# Patient Record
Sex: Female | Born: 1970 | Race: White | Hispanic: No | Smoking: Former smoker
Health system: Southern US, Community
[De-identification: ages and names within clinical notes are randomized; demographics above are authoritative.]

## PROBLEM LIST (undated history)

## (undated) DIAGNOSIS — K5792 Diverticulitis of intestine, part unspecified, without perforation or abscess without bleeding: Secondary | ICD-10-CM

## (undated) DIAGNOSIS — K5790 Diverticulosis of intestine, part unspecified, without perforation or abscess without bleeding: Secondary | ICD-10-CM

## (undated) DIAGNOSIS — Z6720 Type B blood, Rh positive: Secondary | ICD-10-CM

## (undated) DIAGNOSIS — I10 Essential (primary) hypertension: Secondary | ICD-10-CM

## (undated) DIAGNOSIS — D1803 Hemangioma of intra-abdominal structures: Secondary | ICD-10-CM

## (undated) DIAGNOSIS — S62101A Fracture of unspecified carpal bone, right wrist, initial encounter for closed fracture: Secondary | ICD-10-CM

## (undated) DIAGNOSIS — O039 Complete or unspecified spontaneous abortion without complication: Secondary | ICD-10-CM

## (undated) HISTORY — DX: Diverticulosis of intestine, part unspecified, without perforation or abscess without bleeding: K57.90

## (undated) HISTORY — PX: COLONOSCOPY: SHX174

## (undated) HISTORY — PX: TRANSTHORACIC ECHOCARDIOGRAM: SHX275

## (undated) HISTORY — DX: Fracture of unspecified carpal bone, right wrist, initial encounter for closed fracture: S62.101A

## (undated) HISTORY — DX: Complete or unspecified spontaneous abortion without complication: O03.9

## (undated) HISTORY — DX: Essential (primary) hypertension: I10

## (undated) HISTORY — DX: Type B blood, Rh positive: Z67.20

---

## 1999-09-28 ENCOUNTER — Other Ambulatory Visit: Admission: RE | Admit: 1999-09-28 | Discharge: 1999-09-28 | Payer: Self-pay | Admitting: Gynecology

## 2001-01-18 ENCOUNTER — Other Ambulatory Visit: Admission: RE | Admit: 2001-01-18 | Discharge: 2001-01-18 | Payer: Self-pay | Admitting: Gynecology

## 2002-01-11 ENCOUNTER — Emergency Department (HOSPITAL_COMMUNITY): Admission: EM | Admit: 2002-01-11 | Discharge: 2002-01-11 | Payer: Self-pay | Admitting: Emergency Medicine

## 2002-02-05 ENCOUNTER — Other Ambulatory Visit: Admission: RE | Admit: 2002-02-05 | Discharge: 2002-02-05 | Payer: Self-pay | Admitting: Gynecology

## 2003-02-07 ENCOUNTER — Other Ambulatory Visit: Admission: RE | Admit: 2003-02-07 | Discharge: 2003-02-07 | Payer: Self-pay | Admitting: Gynecology

## 2003-02-23 HISTORY — PX: LASER ABLATION OF THE CERVIX: SHX1949

## 2003-03-13 ENCOUNTER — Ambulatory Visit (HOSPITAL_COMMUNITY): Admission: RE | Admit: 2003-03-13 | Discharge: 2003-03-13 | Payer: Self-pay | Admitting: Gynecology

## 2003-03-28 ENCOUNTER — Ambulatory Visit (HOSPITAL_BASED_OUTPATIENT_CLINIC_OR_DEPARTMENT_OTHER): Admission: RE | Admit: 2003-03-28 | Discharge: 2003-03-28 | Payer: Self-pay | Admitting: Gynecology

## 2004-02-10 ENCOUNTER — Other Ambulatory Visit: Admission: RE | Admit: 2004-02-10 | Discharge: 2004-02-10 | Payer: Self-pay | Admitting: Gynecology

## 2004-02-23 HISTORY — PX: HERNIA REPAIR: SHX51

## 2004-11-04 ENCOUNTER — Other Ambulatory Visit: Admission: RE | Admit: 2004-11-04 | Discharge: 2004-11-04 | Payer: Self-pay | Admitting: Gynecology

## 2005-02-22 DIAGNOSIS — O039 Complete or unspecified spontaneous abortion without complication: Secondary | ICD-10-CM

## 2005-02-22 HISTORY — DX: Complete or unspecified spontaneous abortion without complication: O03.9

## 2005-03-23 ENCOUNTER — Other Ambulatory Visit: Admission: RE | Admit: 2005-03-23 | Discharge: 2005-03-23 | Payer: Self-pay | Admitting: Gynecology

## 2007-05-12 ENCOUNTER — Inpatient Hospital Stay (HOSPITAL_COMMUNITY): Admission: AD | Admit: 2007-05-12 | Discharge: 2007-05-15 | Payer: Self-pay | Admitting: Obstetrics & Gynecology

## 2008-06-28 ENCOUNTER — Other Ambulatory Visit: Admission: RE | Admit: 2008-06-28 | Discharge: 2008-06-28 | Payer: Self-pay | Admitting: Gynecology

## 2008-06-28 ENCOUNTER — Ambulatory Visit: Payer: Self-pay | Admitting: Women's Health

## 2008-06-28 ENCOUNTER — Encounter: Payer: Self-pay | Admitting: Women's Health

## 2008-07-26 ENCOUNTER — Ambulatory Visit: Payer: Self-pay | Admitting: Women's Health

## 2009-09-01 ENCOUNTER — Ambulatory Visit: Payer: Self-pay | Admitting: Women's Health

## 2009-09-01 ENCOUNTER — Other Ambulatory Visit: Admission: RE | Admit: 2009-09-01 | Discharge: 2009-09-01 | Payer: Self-pay | Admitting: Gynecology

## 2009-09-17 ENCOUNTER — Ambulatory Visit: Payer: Self-pay | Admitting: Women's Health

## 2010-07-07 NOTE — Discharge Summary (Signed)
NAMEAUDINE, [name]           ACCOUNT NO.:  [id number]   MEDICAL RECORD NO.:  [id number]          PATIENT TYPE:  INP   LOCATION:  9136                          FACILITY:  [facility]   PHYSICIAN:  Gerrit Friends. Aldona Bar, M.D.   DATE OF BIRTH:  05-Apr-1970   DATE OF ADMISSION:  05/12/2007  DATE OF DISCHARGE:  05/15/2007                               DISCHARGE SUMMARY   DISCHARGE DIAGNOSES:  1. Term pregnancy, delivered 8-pound 11-ounce female infant.  Apgars 8      and 9.  2. Blood type B+.  3. Known placenta previa.  4. Labor and bleeding.   PROCEDURE:  Primary low-transverse cesarean section.   SUMMARY:  This 40 year old gravida 3, para 0 with a due date of May 22, 2007, presented at slightly less than 39 weeks with vaginal bleeding  and mild contractions with known placenta previa.  She was diagnosed on  ultrasound antenatally and was scheduled for a primary low-transverse  cesarean section on May 15, 2007, but presented on May 12, 2007,  having mild contractions and vaginal bleeding.  She was group B strep  positive and did receive ampicillin prior to go into the operating room.  She was taken to the operating room on May 12, 2007, by Dr. Dareen Piano,  at which time she was delivered by primary low-transverse cesarean  section of an 8-pound 11-ounce female infant with Apgars of 8 and 9.  Postpartum course was benign.  Discharge hemoglobin 9.7 with a white  count of 10,000, and platelet count of 159,000.  On the morning of May 15, 2007, she was ambulating well, tolerating a regular diet well,  having normal bowel and bladder function.  She was afebrile.  The wound  was clean and dry, and prior to discharge, her staples were removed, and  wound was Steri-Striped with benzoin.   She was given all appropriate instructions at the time of discharge and  understood all instructions well.   DISCHARGE MEDICATIONS:  1. Feosol capsules - 1 daily.  2. Vitamins - 1 a day.  3. Motrin 600  mg every 6 hours as needed for cramping or mild pain.  4. Tylox 1-2 every 4-6 hours as needed for more severe pain.   She will return to the office for followup in approximately 4 weeks'  time.   CONDITION ON DISCHARGE:  Improved.      Gerrit Friends. Aldona Bar, M.D.  Electronically Signed     RMW/MEDQ  D:  05/15/2007  T:  05/16/2007  Job:  [id number]

## 2010-07-07 NOTE — Op Note (Signed)
[name], [name]           ACCOUNT NO.:  [id number]   MEDICAL RECORD NO.:  [id number]          PATIENT TYPE:  INP   LOCATION:  9199                          [facility]ACILITY:  [facility]   PHYSICIAN:  Malva Limes, M.D.    DATE OF BIRTH:  [birth year]   DATE OF PROCEDURE:  05/12/2007  DATE OF DISCHARGE:                               OPERATIVE REPORT   PREOPERATIVE DIAGNOSIS:  1. Intrauterine pregnancy at term.  2. Partial placenta previa, currently bleeding.   POSTOPERATIVE DIAGNOSES:  1. Intrauterine pregnancy at term.  2. Partial placenta previa, currently bleeding.   PROCEDURE:  Primary low transverse cesarean section.   SURGEON:  Malva Limes, M.D.   ANESTHESIA:  Spinal.   ANTIBIOTICS:  Ancef 1 g.   DRAINS:  Foley to bedside drainage.   ESTIMATED BLOOD LOSS:  900 mL.   SPECIMENS:  None.   COMPLICATIONS:  None   FINDINGS:  The patient had normal fallopian tubes bilaterally.  She did  have a 2 cm subserosal fibroid on the anterior fundus.  Uterine cavity  appeared to be normal.   PROCEDURE:  The patient was taken to the operating room, where a spinal  anesthetic was administered without complications.  She was then placed  in the dorsal supine position, with a left lateral tilt.  The patient  was prepped with Betadine, and a Foley catheter was placed.  She was  then draped in the usual fashion for this procedure.  A Pfannenstiel  incision was made.  This was carried down to the fascia.  The fascia was  entered in the midline and extended laterally with the Mayo scissors.  Rectus muscles were then separated from the fascia with the Bovie.  Rectus muscles were then divided in the midline and taken superiorly and  inferiorly.  The parietal peritoneum was entered sharply, taken  superiorly and inferiorly.  Bladder flap was taken down sharply.  A low  transverse uterine incision was made in the midline and extended  laterally with blunt dissection.  Amniotic fluid was noted to  be clear.  The infant was delivered in a vertex presentation.  On delivery of the  head, the oropharynx and nostrils were bulb suctioned.  The remaining  infant was then delivered.  The cord was doubly clamped and cut and the  infant handed to the awaiting NICU team.  The placenta was then manually  removed.  The uterus was exteriorized and examined.  The uterine cavity  was cleaned with a wet lap.  The uterine incision was closed in a single  layer of 0 Monocryl in a running-locking fashion.  The bladder flap was  closed using 2-0 Monocryl suture in a running fashion.  The uterus was  placed back into the abdominal cavity.  Hemostasis was checked and felt  to be adequate.  The parietal peritoneum and rectus muscles were  approximated in the midline using 2-0 Monocryl in a running fashion.  The fascia was closed using 0 Monocryl suture in a running fashion.  Subcuticular tissue was made hemostatic with the Bovie.  Stainless steel  clips were used to close the  skin.  The patient tolerated the procedure  well.  She was taken to the recovery room in stable condition.  Instrument and lap counts were correct x2.           ______________________________  Malva Limes, M.D.     MA/MEDQ  D:  05/12/2007  T:  05/12/2007  Job:  [id number]

## 2010-07-10 NOTE — Op Note (Signed)
[name], [name]                     ACCOUNT NO.:  [id number]   MEDICAL RECORD NO.:  [id number]                   PATIENT TYPE:  AMB   LOCATION:  NESC                                 FACILITY:  [facility]   PHYSICIAN:  Juan H. Lily Peer, M.D.             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   PREOPERATIVE DIAGNOSES:  Cervical intraepithelial neoplasia I.   POSTOPERATIVE DIAGNOSIS:  Cervical intraepithelial neoplasia I.   ANESTHESIA:  General endotracheal anesthesia.   SURGEON:  Juan H. Lily Peer, M.D.   PROCEDURE PERFORMED:  CO2 laser ablation of cervical dysplasia.   DESCRIPTION OF OPERATION:  After the patient was adequately counseled, she  received intravenous sedation, was placed in the high lithotomy position.  The vagina was prepped and draped in the usual sterile fashion.  A titanium-  coated speculum was inserted into the vaginal vault with the vacuum  extractor attached to it.  The cervix was cleansed with acetic acid.  The  colposcope was brought into view.  The previously marked area at 4 o'clock  and 8 o'clock which had demonstrated the cervical dysplasia was once again  identified with the CO2 laser beam with a 1.5 mm spot size and the CO2 laser  wattage set at 10 watts.  First at 8 watts, a circumferential demarcation of  the ectocervix was made outlining the course to be ablated, taking into  account the previously biopsied site at the 4 o'clock and 8 o'clock position  that had the dysplasia.  We kept at least 3-4 mm away from the lesion site,  and then the CO2 laser was increased to 10 watts continuous  mode, and the  area was completely ablated to a depth of approximately 6 mm, to include the  transformation zone.  The patient tolerated the procedure well, and Monsel  solution was used for hemostasis.  No other lesions were noted, and the  patient was transferred to recovery room with stable vital signs.  Blood  loss was minimal, and fluid resuscitation consisted of 1000 mL of lactated  Ringer's.                                               Juan H. Lily Peer, M.D.    JHF/MEDQ  D:  03/28/2003  T:  03/28/2003  Job:  [id number]

## 2010-07-10 NOTE — H&P (Signed)
[name], [name]                     ACCOUNT NO.:  [id number]   MEDICAL RECORD NO.:  [id number]                   PATIENT TYPE:   LOCATION:                                       FACILITY:  [facility]   PHYSICIAN:  Juan H. Lily Peer, M.D.             DATE OF BIRTH:  19-Jan-1971   DATE OF ADMISSION:  03/28/2003  DATE OF DISCHARGE:                                HISTORY & PHYSICAL   CHIEF COMPLAINT:  Cervical dysplasia.   HISTORY:  The patient is a 40 year old gravida 1, para 0, AB  1, who was  seen in the office for her annual gynecological examination on February 07, 2003.  Her Pap smear at that time had demonstrated CIN-1/Vain 1 and HPV in a  few cells.  She underwent a detailed colposcopic evaluation which  demonstrated that the ectocervix at the 4 o'clock position CIN-I, and as  well in the 9 o'clock position.  She had a benign ECC.  She is scheduled to  undergo CO2 laser ablation of her cervix for dysplasia.   PAST MEDICAL HISTORY:  1. The patient is on Ortho-Tri-Cyclen-Lo for contraception.  2. She suffers from anxiety and depression, but is currently on no     medication.  3. She recently had a CT of the abdomen for this lower abdominal wall mass     which corresponded to fat tissue, and she was referred to the general     surgeon, in the event that she would want to have that removed at a later     date.  She denies any prior history of any abnormal Pap smears in the past, and no  other medical problems reported.   ALLERGIES:  ASPIRIN.   FAMILY HISTORY:  Diabetes in the mother and grandfather.  Hypertension in  her maternal grandfather, as well as cardiovascular disease.   PHYSICAL EXAMINATION:  VITAL SIGNS:  Weight 150 pounds, height 5 feet 5  inches tall, blood pressure 100/60.  HEENT:  Unremarkable.  NECK:  Supple.  Trachea midline.  No carotid bruits.  No thyromegaly.  LUNGS:  Clear to auscultation without rhonchi or wheezes.  HEART:  A regular rate and  rhythm.  No murmurs or gallops.  BREASTS:      Examination was not done.  ABDOMEN:  Soft, nontender, without rebound or guarding.  PELVIC:  Bartholin, urethral and Skene's glands were within normal limits.  Vagina and cervix with the lesion described above colposcopically.  RECTAL:  Examination not done.   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   PLAN:  She will undergo a CO2 laser ablation of her cervical dysplasia.  The  risks, benefits and the pros and cons of the procedure were discussed with  the patient, to include infection, bleeding, trauma, or any advertent injury  from the laser beam.  All her  questions were answered.  The patient is  scheduled to undergo a CO2 laser ablation for cervical dysplasia on  Thursday, March 28, 2003, at the [facility].                                               Juan H. Lily Peer, M.D.    JHF/MEDQ  D:  03/26/2003  T:  03/26/2003  Job:  [id number]

## 2010-11-16 LAB — CBC
HCT: 27.1 — ABNORMAL LOW
HCT: 37.3
Hemoglobin: 13
Hemoglobin: 9.7 — ABNORMAL LOW
MCHC: 34.9
MCHC: 36
MCV: 90.7
MCV: 90.9
Platelets: 159
Platelets: 177
RBC: 2.98 — ABNORMAL LOW
RBC: 4.1
RDW: 13.3
RDW: 13.4
WBC: 10
WBC: 6.6

## 2010-11-16 LAB — RPR: RPR Ser Ql: NONREACTIVE

## 2011-06-18 ENCOUNTER — Ambulatory Visit (INDEPENDENT_AMBULATORY_CARE_PROVIDER_SITE_OTHER): Payer: Managed Care, Other (non HMO) | Admitting: Women's Health

## 2011-06-18 ENCOUNTER — Encounter: Payer: Self-pay | Admitting: Women's Health

## 2011-06-18 ENCOUNTER — Other Ambulatory Visit (HOSPITAL_COMMUNITY)
Admission: RE | Admit: 2011-06-18 | Discharge: 2011-06-18 | Disposition: A | Discharge status: Home / Self Care | Payer: Managed Care, Other (non HMO) | Source: Ambulatory Visit | Attending: Obstetrics and Gynecology | Admitting: Obstetrics and Gynecology

## 2011-06-18 VITALS — BP 120/90 | Ht 65.5 in | Wt 161.0 lb

## 2011-06-18 DIAGNOSIS — Z01419 Encounter for gynecological examination (general) (routine) without abnormal findings: Secondary | ICD-10-CM

## 2011-06-18 DIAGNOSIS — Z833 Family history of diabetes mellitus: Secondary | ICD-10-CM

## 2011-06-18 DIAGNOSIS — F419 Anxiety disorder, unspecified: Secondary | ICD-10-CM

## 2011-06-18 DIAGNOSIS — N870 Mild cervical dysplasia: Secondary | ICD-10-CM

## 2011-06-18 DIAGNOSIS — F32A Depression, unspecified: Secondary | ICD-10-CM | POA: Insufficient documentation

## 2011-06-18 DIAGNOSIS — F411 Generalized anxiety disorder: Secondary | ICD-10-CM

## 2011-06-18 DIAGNOSIS — E079 Disorder of thyroid, unspecified: Secondary | ICD-10-CM

## 2011-06-18 DIAGNOSIS — F329 Major depressive disorder, single episode, unspecified: Secondary | ICD-10-CM | POA: Insufficient documentation

## 2011-06-18 DIAGNOSIS — Z13220 Encounter for screening for lipoid disorders: Secondary | ICD-10-CM

## 2011-06-18 LAB — CBC WITH DIFFERENTIAL/PLATELET
Basophils Absolute: 0 10*3/uL (ref 0.0–0.1)
Basophils Relative: 0 % (ref 0–1)
Eosinophils Absolute: 0.1 10*3/uL (ref 0.0–0.7)
Eosinophils Relative: 3 % (ref 0–5)
HCT: 41.4 % (ref 36.0–46.0)
Hemoglobin: 14.1 g/dL (ref 12.0–15.0)
Lymphocytes Relative: 35 % (ref 12–46)
Lymphs Abs: 1.4 10*3/uL (ref 0.7–4.0)
MCH: 30.7 pg (ref 26.0–34.0)
MCHC: 34.1 g/dL (ref 30.0–36.0)
MCV: 90 fL (ref 78.0–100.0)
Monocytes Absolute: 0.2 10*3/uL (ref 0.1–1.0)
Monocytes Relative: 6 % (ref 3–12)
Neutro Abs: 2.2 10*3/uL (ref 1.7–7.7)
Neutrophils Relative %: 55 % (ref 43–77)
Platelets: 268 10*3/uL (ref 150–400)
RBC: 4.6 MIL/uL (ref 3.87–5.11)
RDW: 12.5 % (ref 11.5–15.5)
WBC: 4 10*3/uL (ref 4.0–10.5)

## 2011-06-18 LAB — LIPID PANEL
Cholesterol: 256 mg/dL — ABNORMAL HIGH (ref 0–200)
HDL: 64 mg/dL (ref >39)
LDL Cholesterol: 167 mg/dL — ABNORMAL HIGH (ref 0–99)
Total CHOL/HDL Ratio: 4 Ratio
Triglycerides: 127 mg/dL (ref <150)
VLDL: 25 mg/dL (ref 0–40)

## 2011-06-18 LAB — GLUCOSE, RANDOM: Glucose, Bld: 92 mg/dL (ref 70–99)

## 2011-06-18 MED ORDER — CITALOPRAM HYDROBROMIDE 10 MG PO TABS: 10.0000 mg | ORAL_TABLET | Freq: Every day | ORAL | Status: DC

## 2011-06-18 NOTE — Progress Notes (Signed)
[name] September 18, 1970 [id number]    History:    The patient presents for annual exam.  Monthly 4-5 day cycles/condoms. 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 Normal Paps since 2007. Has not had a mammogram. Has struggled with anxiety and depression, history of bulimia in the past. Currently on no medication.   Past medical history, past surgical history, family history and social history were all reviewed and documented in the EPIC chart. Daughter Penni Bombard 41 years old, doing well.   ROS:  A  ROS was performed and pertinent positives and negatives are included in the history.  Exam:  Filed Vitals:   06/18/11 1216  BP: 120/90    General appearance:  Normal Head/Neck:  Normal, without cervical or supraclavicular adenopathy. Thyroid:  Symmetrical, normal in size, without palpable masses or nodularity. Respiratory  Effort:  Normal  Auscultation:  Clear without wheezing or rhonchi Cardiovascular  Auscultation:  Regular rate, without rubs, murmurs or gallops  Edema/varicosities:  Not grossly evident Abdominal  Soft,nontender, without masses, guarding or rebound.  Liver/spleen:  No organomegaly noted  Hernia:  None appreciated  Skin  Inspection:  Grossly normal  Palpation:  Grossly normal Neurologic/psychiatric  Orientation:  Normal with appropriate conversation.  Mood/affect:  Normal  Genitourinary    Breasts: Examined lying and sitting.     Right: Without masses, retractions, discharge or axillary adenopathy.     Left: Without masses, retractions, discharge or axillary adenopathy.   Inguinal/mons:  Normal without inguinal adenopathy  External genitalia:  Normal  BUS/Urethra/Skene's glands:  Normal  Bladder:  Normal  Vagina:  Normal  Cervix:  Normal  Uterus:   normal in size, shape and contour.  Midline and mobile  Adnexa/parametria:     Rt: Without masses or tenderness.   Lt: Without masses or tenderness.  Anus and perineum: Normal  Digital rectal  exam: Normal sphincter tone without palpated masses or tenderness  Assessment/Plan:  41 y.o. M. WF G3 P1 for annual exam with complaint of headaches prior to menstrual cycle.Marland Kitchen   History of CIN-1 in 05/laser treatment Anxiety/depression  Plan: Has seen Berniece Andreas in the past, instructed to schedule counseling. Celexa 10 mg, will start daily, reviewed importance of exercise, increasingly leisure activities, and counseling. Denies any feelings of harming self. SBE's, schedule screening mammogram, increased regular daily exercise, decrease calories for weight loss, calcium rich diet, MVI daily encouraged. Will try Motrin day prior and first day of cycle for headaches, instructed to call if headaches continue. CBC, TSH, glucose, lipid profile, UA and Pap.   Harrington Challenger WHNP, 1:16 PM 06/18/2011

## 2011-06-18 NOTE — Patient Instructions (Signed)

## 2011-06-18 NOTE — Progress Notes (Signed)
Addended by: Venora Maples on: 06/18/2011 02:49 PM   Modules accepted: Orders

## 2011-06-19 LAB — URINALYSIS W MICROSCOPIC + REFLEX CULTURE
Bacteria, UA: NONE SEEN
Bilirubin Urine: NEGATIVE
Casts: NONE SEEN
Crystals: NONE SEEN
Glucose, UA: NEGATIVE mg/dL
Hgb urine dipstick: NEGATIVE
Ketones, ur: NEGATIVE mg/dL
Nitrite: NEGATIVE
Protein, ur: NEGATIVE mg/dL
Specific Gravity, Urine: 1.016 (ref 1.005–1.030)
Squamous Epithelial / HPF: NONE SEEN
Urobilinogen, UA: 0.2 mg/dL (ref 0.0–1.0)
pH: 5.5 (ref 5.0–8.0)

## 2011-06-19 LAB — TSH: TSH: 1.358 u[IU]/mL (ref 0.350–4.500)

## 2011-06-20 LAB — URINE CULTURE
Colony Count: NO GROWTH
Organism ID, Bacteria: NO GROWTH

## 2011-06-21 ENCOUNTER — Other Ambulatory Visit: Payer: Self-pay | Admitting: *Deleted

## 2011-06-21 ENCOUNTER — Encounter: Payer: Self-pay | Admitting: Women's Health

## 2011-06-21 DIAGNOSIS — E7800 Pure hypercholesterolemia, unspecified: Secondary | ICD-10-CM

## 2011-07-09 ENCOUNTER — Other Ambulatory Visit: Payer: Self-pay | Admitting: Women's Health

## 2011-07-09 ENCOUNTER — Telehealth: Payer: Self-pay | Admitting: *Deleted

## 2011-07-09 DIAGNOSIS — F329 Major depressive disorder, single episode, unspecified: Secondary | ICD-10-CM

## 2011-07-09 MED ORDER — ESCITALOPRAM OXALATE 10 MG PO TABS: 10.0000 mg | ORAL_TABLET | Freq: Every day | ORAL | Status: DC

## 2011-07-09 NOTE — Telephone Encounter (Signed)
Pt was given celexa 10 mg on 06/18/11 pt took her first dose on Saturday 07/03/11 and yesterday experienced tingling inside her mouth and lip felt numb on right side with slight swelling. She was out of town recently an got bit by some type of bug on her legs. Pt would like you advice, call back number [phone]

## 2011-07-09 NOTE — Telephone Encounter (Signed)
Telephone call, instructed to stop the Celexa, could be a drug allergy. Instructed to call if tingling and mouth continues. Patient states she has felt better, less down. Will wait about a week and then try Lexapro 10 mg by mouth daily. Instructed to call if symptoms continue and continue counseling.

## 2012-02-03 ENCOUNTER — Telehealth: Payer: Self-pay | Admitting: *Deleted

## 2012-02-03 DIAGNOSIS — F329 Major depressive disorder, single episode, unspecified: Secondary | ICD-10-CM

## 2012-02-03 MED ORDER — ESCITALOPRAM OXALATE 10 MG PO TABS: 10.0000 mg | ORAL_TABLET | Freq: Every day | ORAL | Status: DC

## 2012-02-03 NOTE — Telephone Encounter (Signed)
Telephone call to review Lexapro request, states is doing well on Lexapro 10 would like to continue. States does not feel she needs counseling at this point has had in the past. States life is better, changing jobs to a local position to avoid frequent travel.  Jen  -  Please call in Lexapro 10 mg daily # 30 with #6 refills.

## 2012-02-03 NOTE — Telephone Encounter (Signed)
Pt called requesting refill on Lexapro 10 mg. Okay to fill?

## 2012-02-03 NOTE — Telephone Encounter (Signed)
rx sent

## 2012-02-24 ENCOUNTER — Telehealth: Payer: Self-pay | Admitting: *Deleted

## 2012-02-24 MED ORDER — ESCITALOPRAM OXALATE 10 MG PO TABS: 10.0000 mg | ORAL_TABLET | Freq: Every day | ORAL | Status: DC

## 2012-02-24 NOTE — Telephone Encounter (Signed)
Pt called requesting 90 day supply of lexapro 10 mg. Rx sent.

## 2012-02-25 ENCOUNTER — Encounter: Payer: Self-pay | Admitting: Gynecology

## 2014-02-17 ENCOUNTER — Emergency Department (HOSPITAL_BASED_OUTPATIENT_CLINIC_OR_DEPARTMENT_OTHER): Payer: BC Managed Care – PPO

## 2014-02-17 ENCOUNTER — Encounter (HOSPITAL_BASED_OUTPATIENT_CLINIC_OR_DEPARTMENT_OTHER): Payer: Self-pay | Admitting: *Deleted

## 2014-02-17 ENCOUNTER — Emergency Department (HOSPITAL_BASED_OUTPATIENT_CLINIC_OR_DEPARTMENT_OTHER)
Admission: EM | Admit: 2014-02-17 | Discharge: 2014-02-17 | Disposition: A | Discharge status: Home / Self Care | Payer: BC Managed Care – PPO | Attending: Emergency Medicine | Admitting: Emergency Medicine

## 2014-02-17 DIAGNOSIS — R05 Cough: Secondary | ICD-10-CM

## 2014-02-17 DIAGNOSIS — Z7952 Long term (current) use of systemic steroids: Secondary | ICD-10-CM | POA: Insufficient documentation

## 2014-02-17 DIAGNOSIS — R059 Cough, unspecified: Secondary | ICD-10-CM

## 2014-02-17 DIAGNOSIS — Z792 Long term (current) use of antibiotics: Secondary | ICD-10-CM | POA: Insufficient documentation

## 2014-02-17 DIAGNOSIS — F419 Anxiety disorder, unspecified: Secondary | ICD-10-CM | POA: Diagnosis not present

## 2014-02-17 DIAGNOSIS — J159 Unspecified bacterial pneumonia: Secondary | ICD-10-CM | POA: Insufficient documentation

## 2014-02-17 DIAGNOSIS — Z87448 Personal history of other diseases of urinary system: Secondary | ICD-10-CM | POA: Diagnosis not present

## 2014-02-17 DIAGNOSIS — R079 Chest pain, unspecified: Secondary | ICD-10-CM | POA: Diagnosis present

## 2014-02-17 DIAGNOSIS — Z79899 Other long term (current) drug therapy: Secondary | ICD-10-CM | POA: Insufficient documentation

## 2014-02-17 DIAGNOSIS — J189 Pneumonia, unspecified organism: Secondary | ICD-10-CM

## 2014-02-17 DIAGNOSIS — F329 Major depressive disorder, single episode, unspecified: Secondary | ICD-10-CM | POA: Diagnosis not present

## 2014-02-17 LAB — CBC WITH DIFFERENTIAL/PLATELET
Basophils Absolute: 0 10*3/uL (ref 0.0–0.1)
Basophils Relative: 0 % (ref 0–1)
Eosinophils Absolute: 0.1 10*3/uL (ref 0.0–0.7)
Eosinophils Relative: 1 % (ref 0–5)
HCT: 39.9 % (ref 36.0–46.0)
Hemoglobin: 13.7 g/dL (ref 12.0–15.0)
Lymphocytes Relative: 11 % — ABNORMAL LOW (ref 12–46)
Lymphs Abs: 1 10*3/uL (ref 0.7–4.0)
MCH: 31.1 pg (ref 26.0–34.0)
MCHC: 34.3 g/dL (ref 30.0–36.0)
MCV: 90.5 fL (ref 78.0–100.0)
Monocytes Absolute: 0.5 10*3/uL (ref 0.1–1.0)
Monocytes Relative: 6 % (ref 3–12)
Neutro Abs: 7.4 10*3/uL (ref 1.7–7.7)
Neutrophils Relative %: 82 % — ABNORMAL HIGH (ref 43–77)
Platelets: 263 10*3/uL (ref 150–400)
RBC: 4.41 MIL/uL (ref 3.87–5.11)
RDW: 11.9 % (ref 11.5–15.5)
WBC: 9 10*3/uL (ref 4.0–10.5)

## 2014-02-17 LAB — COMPREHENSIVE METABOLIC PANEL
ALT: 16 U/L (ref 0–35)
AST: 19 U/L (ref 0–37)
Albumin: 4.1 g/dL (ref 3.5–5.2)
Alkaline Phosphatase: 75 U/L (ref 39–117)
Anion gap: 9 (ref 5–15)
BUN: 10 mg/dL (ref 6–23)
CO2: 26 mmol/L (ref 19–32)
Calcium: 8.7 mg/dL (ref 8.4–10.5)
Chloride: 104 mEq/L (ref 96–112)
Creatinine, Ser: 0.55 mg/dL (ref 0.50–1.10)
GFR calc Af Amer: >90 mL/min (ref >90)
GFR calc non Af Amer: >90 mL/min (ref >90)
Glucose, Bld: 115 mg/dL — ABNORMAL HIGH (ref 70–99)
Potassium: 3.8 mmol/L (ref 3.5–5.1)
Sodium: 139 mmol/L (ref 135–145)
Total Bilirubin: 0.6 mg/dL (ref 0.3–1.2)
Total Protein: 6.9 g/dL (ref 6.0–8.3)

## 2014-02-17 LAB — TROPONIN I: Troponin I: <0.03 ng/mL (ref <0.031)

## 2014-02-17 LAB — LIPASE, BLOOD: Lipase: 29 U/L (ref 11–59)

## 2014-02-17 MED ORDER — IOHEXOL 350 MG/ML SOLN
100.0000 mL | Freq: Once | INTRAVENOUS | Status: AC | PRN
  Administered 2014-02-17: 100 mL via INTRAVENOUS

## 2014-02-17 MED ORDER — PREDNISONE 20 MG PO TABS: 40.0000 mg | ORAL_TABLET | Freq: Every day | ORAL | Status: DC

## 2014-02-17 MED ORDER — GI COCKTAIL ~~LOC~~
30.0000 mL | Freq: Once | ORAL | Status: AC
  Administered 2014-02-17: 30 mL via ORAL
  Filled 2014-02-17: qty 30

## 2014-02-17 MED ORDER — HYDROCODONE-HOMATROPINE 5-1.5 MG/5ML PO SYRP: 5.0000 mL | ORAL_SOLUTION | Freq: Four times a day (QID) | ORAL | Status: DC | PRN

## 2014-02-17 MED ORDER — MORPHINE SULFATE 4 MG/ML IJ SOLN: 4.0000 mg | Freq: Once | INTRAMUSCULAR | Status: DC

## 2014-02-17 MED ORDER — ONDANSETRON HCL 4 MG/2ML IJ SOLN: 4.0000 mg | Freq: Once | INTRAMUSCULAR | Status: DC

## 2014-02-17 MED ORDER — DILTIAZEM HCL 25 MG/5ML IV SOLN
20.0000 mg | Freq: Once | INTRAVENOUS | Status: DC
  Filled 2014-02-17: qty 5

## 2014-02-17 MED ORDER — LEVOFLOXACIN 750 MG PO TABS: 750.0000 mg | ORAL_TABLET | Freq: Every day | ORAL | Status: DC

## 2014-02-17 NOTE — Discharge Instructions (Signed)
Return to the ER if you have worsening pain or shortness of breath. Follow-up with your doctor next week for a recheck and to consider repeating the CAT scan in 3 months as suggested by radiologist.  Pneumonia Pneumonia is an infection of the lungs.  CAUSES Pneumonia may be caused by bacteria or a virus. Usually, these infections are caused by breathing infectious particles into the lungs (respiratory tract). SIGNS AND SYMPTOMS   Cough.  Fever.  Chest pain.  Increased rate of breathing.  Wheezing.  Mucus production. DIAGNOSIS  If you have the common symptoms of pneumonia, your health care provider will typically confirm the diagnosis with a chest X-ray. The X-ray will show an abnormality in the lung (pulmonary infiltrate) if you have pneumonia. Other tests of your blood, urine, or sputum may be done to find the specific cause of your pneumonia. Your health care provider may also do tests (blood gases or pulse oximetry) to see how well your lungs are working. TREATMENT  Some forms of pneumonia may be spread to other people when you cough or sneeze. You may be asked to wear a mask before and during your exam. Pneumonia that is caused by bacteria is treated with antibiotic medicine. Pneumonia that is caused by the influenza virus may be treated with an antiviral medicine. Most other viral infections must run their course. These infections will not respond to antibiotics.  HOME CARE INSTRUCTIONS   Cough suppressants may be used if you are losing too much rest. However, coughing protects you by clearing your lungs. You should avoid using cough suppressants if you can.  Your health care provider may have prescribed medicine if he or she thinks your pneumonia is caused by bacteria or influenza. Finish your medicine even if you start to feel better.  Your health care provider may also prescribe an expectorant. This loosens the mucus to be coughed up.  Take medicines only as directed by your  health care provider.  Do not smoke. Smoking is a common cause of bronchitis and can contribute to pneumonia. If you are a smoker and continue to smoke, your cough may last several weeks after your pneumonia has cleared.  A cold steam vaporizer or humidifier in your room or home may help loosen mucus.  Coughing is often worse at night. Sleeping in a semi-upright position in a recliner or using a couple pillows under your head will help with this.  Get rest as you feel it is needed. Your body will usually let you know when you need to rest. PREVENTION A pneumococcal shot (vaccine) is available to prevent a common bacterial cause of pneumonia. This is usually suggested for:  People over 58 years old.  Patients on chemotherapy.  People with chronic lung problems, such as bronchitis or emphysema.  People with immune system problems. If you are over 65 or have a high risk condition, you may receive the pneumococcal vaccine if you have not received it before. In some countries, a routine influenza vaccine is also recommended. This vaccine can help prevent some cases of pneumonia.You may be offered the influenza vaccine as part of your care. If you smoke, it is time to quit. You may receive instructions on how to stop smoking. Your health care provider can provide medicines and counseling to help you quit. SEEK MEDICAL CARE IF: You have a fever. SEEK IMMEDIATE MEDICAL CARE IF:   Your illness becomes worse. This is especially true if you are elderly or weakened from any other  disease.  You cannot control your cough with suppressants and are losing sleep.  You begin coughing up blood.  You develop pain which is getting worse or is uncontrolled with medicines.  Any of the symptoms which initially brought you in for treatment are getting worse rather than better.  You develop shortness of breath or chest pain. MAKE SURE YOU:   Understand these instructions.  Will watch your  condition.  Will get help right away if you are not doing well or get worse. Document Released: 02/08/2005 Document Revised: 06/25/2013 Document Reviewed: 04/30/2010 [facility] Patient Information 2015 [place], [place]. This information is not intended to replace advice given to you by your health care provider. Make sure you discuss any questions you have with your health care provider.

## 2014-02-17 NOTE — ED Notes (Signed)
C/o R chest pain, "feels like indigestion", also mentions radiation to jaw and arm. Reports productive cough x1 week (green & thick) and hoarse voice, developed nv onset at 0200, vomited x3. (denies: fever, sob, diarrhea bleeding or dizziness), [facility] and dx'd with sinus and ear infection, started on augmentin, flonase and albuterol. Rates pain 5/10,

## 2014-02-17 NOTE — ED Notes (Addendum)
Returns from xray, no changes, remains alert, NAD, calm, interactive. Husband at [facility]. Attempted IV x1 (unsuccessful). Blood obtained and sent. Pt "wants to wait on IV".

## 2014-02-17 NOTE — ED Notes (Signed)
Patient denies pain and nausea, doesn't want meds at this time

## 2014-02-17 NOTE — ED Provider Notes (Signed)
CSN: [id number]     Arrival date & time 02/17/14  0522 History   First MD Initiated Contact with Patient 02/17/14 [id number]     Chief Complaint  Patient presents with  . Chest Pain     (Consider location/radiation/quality/duration/timing/severity/associated sxs/prior Treatment) HPI Comments: Patient presents to the ER for evaluation of chest pain. Patient reports that she awakened early this morning with right-sided chest pain. She reports that the pain was present for a couple of hours before she decided to come to the ER. She is not short of breath. She does, however, report that she has been sick this week. She has been experiencing increasing cough which is productive and has had a headache and sinus congestion. [facility] and started on Augmentin. She took the first dose last night prior to onset of the symptoms.  Patient is a 43 y.o. female presenting with chest pain.  Chest Pain Associated symptoms: cough     Past Medical History  Diagnosis Date  . SAB (spontaneous abortion) 2007  . Type B blood, Rh positive   . Anxiety   . Depression   . Bulimia     history of  . CIN I (cervical intraepithelial neoplasia I)    Past Surgical History  Procedure Laterality Date  . Laser ablation of the cervix  2005    cin I  . Hernia repair  2006  . Cesarean section  2009   Family History  Problem Relation Age of Onset  . Diabetes Maternal Grandfather   . Hypertension Maternal Grandfather    History  Substance Use Topics  . Smoking status: Never Smoker   . Smokeless tobacco: Not on file  . Alcohol Use: Yes   OB History    No data available     Review of Systems  HENT: Positive for congestion.   Respiratory: Positive for cough.   Cardiovascular: Positive for chest pain.  All other systems reviewed and are negative.     Allergies  Aspirin and Other  Home Medications   Prior to Admission medications   Medication Sig Start Date End Date  Taking? Authorizing Provider  escitalopram (LEXAPRO) 10 MG tablet Take 1 tablet (10 mg total) by mouth daily. 07/09/11 10/07/11  Huel Cote, NP  escitalopram (LEXAPRO) 10 MG tablet Take 1 tablet (10 mg total) by mouth daily. 02/24/12   Huel Cote, NP  HYDROcodone-homatropine [facility]) 5-1.5 MG/5ML syrup Take 5 mLs by mouth every 6 (six) hours as needed for cough. 02/17/14   Orpah Greek, MD  levofloxacin (LEVAQUIN) 750 MG tablet Take 1 tablet (750 mg total) by mouth daily. 02/17/14   Orpah Greek, MD  predniSONE (DELTASONE) 20 MG tablet Take 2 tablets (40 mg total) by mouth daily with breakfast. 02/17/14   Orpah Greek, MD   BP 131/84 mmHg  Pulse 91  Temp(Src) 98.6 F (37 C) (Oral)  Resp 16  Ht 5\' 5"  (1.651 m)  Wt 160 lb (72.576 kg)  BMI 26.63 kg/m2  SpO2 97%  LMP 02/08/2014 Physical Exam  Constitutional: She is oriented to person, place, and time. She appears well-developed and well-nourished. No distress.  HENT:  Head: Normocephalic and atraumatic.  Right Ear: Hearing normal.  Left Ear: Hearing normal.  Nose: Nose normal.  Mouth/Throat: Oropharynx is clear and moist and mucous membranes are normal.  Eyes: Conjunctivae and EOM are normal. Pupils are equal, round, and reactive to light.  Neck: Normal range of  motion. Neck supple.  Cardiovascular: Regular rhythm, S1 normal and S2 normal.  Exam reveals no gallop and no friction rub.   No murmur heard. Pulmonary/Chest: Effort normal and breath sounds normal. No respiratory distress. She exhibits no tenderness.  Abdominal: Soft. Normal appearance and bowel sounds are normal. There is no hepatosplenomegaly. There is no tenderness. There is no rebound, no guarding, no tenderness at McBurney's point and negative Murphy's sign. No hernia.  Musculoskeletal: Normal range of motion.  Neurological: She is alert and oriented to person, place, and time. She has normal strength. No cranial nerve deficit or sensory  deficit. Coordination normal. GCS eye subscore is 4. GCS verbal subscore is 5. GCS motor subscore is 6.  Skin: Skin is warm, dry and intact. No rash noted. No cyanosis.  Psychiatric: She has a normal mood and affect. Her speech is normal and behavior is normal. Thought content normal.  Nursing note and vitals reviewed.   ED Course  Procedures (including critical care time) Labs Review Labs Reviewed  CBC WITH DIFFERENTIAL - Abnormal; Notable for the following:    Neutrophils Relative % 82 (*)    Lymphocytes Relative 11 (*)    All other components within normal limits  COMPREHENSIVE METABOLIC PANEL - Abnormal; Notable for the following:    Glucose, Bld 115 (*)    All other components within normal limits  TROPONIN I  LIPASE, BLOOD    Imaging Review Dg Chest 2 View  02/17/2014   CLINICAL DATA:  Acute onset of right-sided chest pain, radiating to the jaw and arm. Productive cough for 1 week. Initial encounter.  EXAM: CHEST  2 VIEW  COMPARISON:  None.  FINDINGS: The lungs are well-aerated and clear. There is no evidence of focal opacification, pleural effusion or pneumothorax.  The heart is normal in size; the mediastinal contour is within normal limits. No acute osseous abnormalities are seen.  IMPRESSION: No acute cardiopulmonary process seen.   Electronically Signed   By: Garald Balding M.D.   On: 02/17/2014 06:40   Ct Angio Chest Pe W/cm &/or Wo Cm  02/17/2014   CLINICAL DATA:  Right-sided chest pain for 1 day.  Cough for 1 date  EXAM: CT ANGIOGRAPHY CHEST WITH CONTRAST  TECHNIQUE: Multidetector CT imaging of the chest was performed using the standard protocol during bolus administration of intravenous contrast. Multiplanar CT image reconstructions and MIPs were obtained to evaluate the vascular anatomy.  CONTRAST:  133mL OMNIPAQUE IOHEXOL 350 MG/ML SOLN  COMPARISON:  None.  FINDINGS: There are no filling defects in the pulmonary arterial tree to suggest acute pulmonary thromboembolism  No  abnormal mediastinal adenopathy.  Aorta is opacified.  No evidence of dissection or transection.  No pneumothorax.  No pleural effusion.  Minimal dependent atelectasis at the lung bases.  Minimal patchy parenchymal opacities in the superior segment of the left lower lobe.  No acute bony deformity.  5.9 cm hemangioma in the left lobe of the liver. A significant portion of the hemangioma is subcapsular. There is a tiny blush of contrast in the right lobe of the liver on image 82 is series for towards the dome that is likely an additional home angioma or other benign entity.  Review of the MIP images confirms the above findings.  IMPRESSION: No evidence of acute pulmonary thromboembolism.  There are patchy densities in the superior segment of the left lower lobe. This is likely an inflammatory process. Initial follow-up by chest CT without contrast is recommended in 3 months  to confirm persistence. This recommendation follows the consensus statement: Recommendations for the Management of Subsolid Pulmonary Nodules Detected at CT: A Statement from the [employer] as published in Radiology 2013; 266:304-317.  5.9 cm hemangioma in the left lobe of the liver. There is an additional blush of contrast that is likely a small hemangioma. These are benign and the TS.   Electronically Signed   By: Maryclare Bean M.D.   On: 02/17/2014 08:29     EKG Interpretation   Date/Time:  Sunday February 17 2014 05:33:58 EST Ventricular Rate:  95 PR Interval:  140 QRS Duration: 76 QT Interval:  354 QTC Calculation: 444 R Axis:   31 Text Interpretation:  Normal sinus rhythm Cannot rule out Anterior infarct  , age undetermined Abnormal ECG Confirmed by Girlie Veltri  MD, Yannely Kintzel  [phone]) on 02/17/2014 7:07:13 AM      MDM   Final diagnoses:  Chest pain  Cough  CAP (community acquired pneumonia)   Patient presents to the ER for evaluation of chest pain. Patient had onset of right upper chest pain while at rest last  night. Pain has been pain has been continuous for several hours. Pain is very atypical for cardiac chest pain. She does not have cardiac risk factors. Cardiac workup was unremarkable. The only finding was tachycardia at times. I believe this was secondary to anxiety, but I could not rule out PE. CT angiography was therefore performed. No PE was seen, but there is an inflammatory process which I believe to be pneumonia based on her current symptoms. She has had upper respiratory infection symptoms for some time. There was concern over some itching when she took the Augmentin, this will be stopped. She will be placed on Levaquin. Continue the albuterol. Will add prednisone and Vicodin. Follow-up with primary doctor to consider repeat CT scan in 3 months as recommended by radiology.    Orpah Greek, MD 02/17/14 [phone]

## 2014-05-10 ENCOUNTER — Encounter: Payer: Self-pay | Admitting: Women's Health

## 2014-05-10 ENCOUNTER — Ambulatory Visit (INDEPENDENT_AMBULATORY_CARE_PROVIDER_SITE_OTHER): Payer: BLUE CROSS/BLUE SHIELD | Admitting: Women's Health

## 2014-05-10 ENCOUNTER — Other Ambulatory Visit (HOSPITAL_COMMUNITY)
Admission: RE | Admit: 2014-05-10 | Discharge: 2014-05-10 | Disposition: A | Discharge status: Home / Self Care | Payer: BLUE CROSS/BLUE SHIELD | Source: Ambulatory Visit | Attending: Women's Health | Admitting: Women's Health

## 2014-05-10 VITALS — BP 124/80 | Ht 65.0 in | Wt 161.0 lb

## 2014-05-10 DIAGNOSIS — Z01419 Encounter for gynecological examination (general) (routine) without abnormal findings: Secondary | ICD-10-CM | POA: Diagnosis not present

## 2014-05-10 DIAGNOSIS — Z1151 Encounter for screening for human papillomavirus (HPV): Secondary | ICD-10-CM | POA: Diagnosis present

## 2014-05-10 DIAGNOSIS — Z13220 Encounter for screening for lipoid disorders: Secondary | ICD-10-CM

## 2014-05-10 DIAGNOSIS — Z833 Family history of diabetes mellitus: Secondary | ICD-10-CM

## 2014-05-10 LAB — CBC WITH DIFFERENTIAL/PLATELET
BASOS ABS: 0 10*3/uL (ref 0.0–0.1)
Basophils Relative: 0 % (ref 0–1)
Eosinophils Absolute: 0.1 10*3/uL (ref 0.0–0.7)
Eosinophils Relative: 1 % (ref 0–5)
HCT: 40.7 % (ref 36.0–46.0)
HEMOGLOBIN: 14.2 g/dL (ref 12.0–15.0)
LYMPHS ABS: 1.2 10*3/uL (ref 0.7–4.0)
LYMPHS PCT: 21 % (ref 12–46)
MCH: 30.6 pg (ref 26.0–34.0)
MCHC: 34.9 g/dL (ref 30.0–36.0)
MCV: 87.7 fL (ref 78.0–100.0)
MPV: 9.9 fL (ref 8.6–12.4)
Monocytes Absolute: 0.4 10*3/uL (ref 0.1–1.0)
Monocytes Relative: 7 % (ref 3–12)
NEUTROS ABS: 4.2 10*3/uL (ref 1.7–7.7)
Neutrophils Relative %: 71 % (ref 43–77)
Platelets: 289 10*3/uL (ref 150–400)
RBC: 4.64 MIL/uL (ref 3.87–5.11)
RDW: 12.8 % (ref 11.5–15.5)
WBC: 5.9 10*3/uL (ref 4.0–10.5)

## 2014-05-10 LAB — LIPID PANEL
Cholesterol: 208 mg/dL — ABNORMAL HIGH (ref 0–200)
HDL: 65 mg/dL (ref >=46)
LDL CALC: 126 mg/dL — AB (ref 0–99)
Total CHOL/HDL Ratio: 3.2 Ratio
Triglycerides: 86 mg/dL (ref <150)
VLDL: 17 mg/dL (ref 0–40)

## 2014-05-10 LAB — GLUCOSE, RANDOM: GLUCOSE: 101 mg/dL — AB (ref 70–99)

## 2014-05-10 LAB — TSH: TSH: 1.632 u[IU]/mL (ref 0.350–4.500)

## 2014-05-10 NOTE — Progress Notes (Signed)
[name] Nov 11, 1970 [id number]    History:    Presents for annual exam.  Last office visit 2013. 2005 CIN-1-laser with normal Paps after monthly cycle/condoms/not sexually active greater than one year. Marital issues, no infidelity or abuse. Normal mammogram, overdue. 01/2014 pneumonia, elevated blood sugar.  Past medical history, past surgical history, family history and social history were all reviewed and documented in the EPIC chart. Works for [employer]. Daughter Delilah Shan 7 doing well. History of anxiety and depression on no medication.  ROS:  A ROS was performed and pertinent positives and negatives are included.  Exam:  Filed Vitals:   05/10/14 0900  BP: 124/80    General appearance:  Normal Thyroid:  Symmetrical, normal in size, without palpable masses or nodularity. Respiratory  Auscultation:  Clear without wheezing or rhonchi Cardiovascular  Auscultation:  Regular rate, without rubs, murmurs or gallops  Edema/varicosities:  Not grossly evident Abdominal  Soft,nontender, without masses, guarding or rebound.  Liver/spleen:  No organomegaly noted  Hernia:  None appreciated  Skin  Inspection:  Grossly normal   Breasts: Examined lying and sitting.     Right: Without masses, retractions, discharge or axillary adenopathy.     Left: Without masses, retractions, discharge or axillary adenopathy. Gentitourinary   Inguinal/mons:  Normal without inguinal adenopathy  External genitalia:  Normal  BUS/Urethra/Skene's glands:  Normal  Vagina:  Normal  Cervix:  Normal  Uterus:   normal in size, shape and contour.  Midline and mobile  Adnexa/parametria:     Rt: Without masses or tenderness.   Lt: Without masses or tenderness.  Anus and perineum: Normal  Digital rectal exam: Normal sphincter tone without palpated masses or tenderness  Assessment/Plan:  44 y.o. MWF G2P1 for annual exam.   Situational stress/marriage Monthly cycle/condoms 2005 CIN-1 with normal  Paps after  Plan: SBE's, reviewed importance of annual screening mammogram, instructed to schedule. Increase regular exercise, calcium rich diet, vitamin D 1000 daily encouraged. Strongly encouraged counseling, instructed to schedule. CBC, lipid panel, glucose, TSH, UA, Pap with HR HPV typing, Pap normal at last office visit in 2013, new screening guidelines reviewed.   Huel Cote [facility], 10:42 AM 05/10/2014

## 2014-05-10 NOTE — Patient Instructions (Signed)

## 2014-05-11 LAB — URINALYSIS W MICROSCOPIC + REFLEX CULTURE
Bilirubin Urine: NEGATIVE
Casts: NONE SEEN
Crystals: NONE SEEN
Glucose, UA: NEGATIVE mg/dL
Hgb urine dipstick: NEGATIVE
Ketones, ur: NEGATIVE mg/dL
Nitrite: NEGATIVE
Protein, ur: NEGATIVE mg/dL
Specific Gravity, Urine: 1.015 (ref 1.005–1.030)
Urobilinogen, UA: 0.2 mg/dL (ref 0.0–1.0)
pH: 5.5 (ref 5.0–8.0)

## 2014-05-12 LAB — URINE CULTURE
COLONY COUNT: NO GROWTH
Organism ID, Bacteria: NO GROWTH

## 2014-05-14 LAB — CYTOLOGY - PAP

## 2015-04-24 ENCOUNTER — Encounter (HOSPITAL_BASED_OUTPATIENT_CLINIC_OR_DEPARTMENT_OTHER): Payer: Self-pay | Admitting: *Deleted

## 2015-04-24 ENCOUNTER — Emergency Department (HOSPITAL_BASED_OUTPATIENT_CLINIC_OR_DEPARTMENT_OTHER): Payer: BLUE CROSS/BLUE SHIELD

## 2015-04-24 ENCOUNTER — Emergency Department (HOSPITAL_BASED_OUTPATIENT_CLINIC_OR_DEPARTMENT_OTHER)
Admission: EM | Admit: 2015-04-24 | Discharge: 2015-04-24 | Disposition: A | Discharge status: Home / Self Care | Payer: BLUE CROSS/BLUE SHIELD | Attending: Emergency Medicine | Admitting: Emergency Medicine

## 2015-04-24 ENCOUNTER — Telehealth: Payer: Self-pay | Admitting: Internal Medicine

## 2015-04-24 DIAGNOSIS — R160 Hepatomegaly, not elsewhere classified: Secondary | ICD-10-CM

## 2015-04-24 DIAGNOSIS — R1011 Right upper quadrant pain: Secondary | ICD-10-CM

## 2015-04-24 DIAGNOSIS — Z3202 Encounter for pregnancy test, result negative: Secondary | ICD-10-CM | POA: Insufficient documentation

## 2015-04-24 DIAGNOSIS — K5732 Diverticulitis of large intestine without perforation or abscess without bleeding: Secondary | ICD-10-CM | POA: Insufficient documentation

## 2015-04-24 DIAGNOSIS — Z87891 Personal history of nicotine dependence: Secondary | ICD-10-CM | POA: Diagnosis not present

## 2015-04-24 LAB — LIPASE, BLOOD: LIPASE: 32 U/L (ref 11–51)

## 2015-04-24 LAB — COMPREHENSIVE METABOLIC PANEL
ALBUMIN: 4.2 g/dL (ref 3.5–5.0)
ALT: 16 U/L (ref 14–54)
AST: 19 U/L (ref 15–41)
Alkaline Phosphatase: 60 U/L (ref 38–126)
Anion gap: 11 (ref 5–15)
BUN: 11 mg/dL (ref 6–20)
CHLORIDE: 103 mmol/L (ref 101–111)
CO2: 23 mmol/L (ref 22–32)
CREATININE: 0.58 mg/dL (ref 0.44–1.00)
Calcium: 8.8 mg/dL — ABNORMAL LOW (ref 8.9–10.3)
GFR calc Af Amer: >60 mL/min (ref >60)
GLUCOSE: 102 mg/dL — AB (ref 65–99)
POTASSIUM: 3.7 mmol/L (ref 3.5–5.1)
SODIUM: 137 mmol/L (ref 135–145)
Total Bilirubin: 1.3 mg/dL — ABNORMAL HIGH (ref 0.3–1.2)
Total Protein: 7.1 g/dL (ref 6.5–8.1)

## 2015-04-24 LAB — CBC WITH DIFFERENTIAL/PLATELET
BASOS ABS: 0 10*3/uL (ref 0.0–0.1)
BASOS PCT: 0 %
EOS PCT: 1 %
Eosinophils Absolute: 0.1 10*3/uL (ref 0.0–0.7)
HCT: 39.9 % (ref 36.0–46.0)
Hemoglobin: 13.8 g/dL (ref 12.0–15.0)
LYMPHS PCT: 16 %
Lymphs Abs: 1.1 10*3/uL (ref 0.7–4.0)
MCH: 30.9 pg (ref 26.0–34.0)
MCHC: 34.6 g/dL (ref 30.0–36.0)
MCV: 89.3 fL (ref 78.0–100.0)
MONO ABS: 0.5 10*3/uL (ref 0.1–1.0)
Monocytes Relative: 7 %
NEUTROS ABS: 5.3 10*3/uL (ref 1.7–7.7)
Neutrophils Relative %: 76 %
PLATELETS: 216 10*3/uL (ref 150–400)
RBC: 4.47 MIL/uL (ref 3.87–5.11)
RDW: 12.3 % (ref 11.5–15.5)
WBC: 7 10*3/uL (ref 4.0–10.5)

## 2015-04-24 LAB — URINE MICROSCOPIC-ADD ON

## 2015-04-24 LAB — URINALYSIS, ROUTINE W REFLEX MICROSCOPIC
BILIRUBIN URINE: NEGATIVE
GLUCOSE, UA: NEGATIVE mg/dL
KETONES UR: NEGATIVE mg/dL
Nitrite: NEGATIVE
PH: 6 (ref 5.0–8.0)
Protein, ur: NEGATIVE mg/dL
Specific Gravity, Urine: 1.021 (ref 1.005–1.030)

## 2015-04-24 LAB — PREGNANCY, URINE: PREG TEST UR: NEGATIVE

## 2015-04-24 MED ORDER — ONDANSETRON HCL 4 MG/2ML IJ SOLN
4.0000 mg | Freq: Once | INTRAMUSCULAR | Status: AC
Start: 2015-04-24 — End: 2015-04-24
  Administered 2015-04-24: 4 mg via INTRAVENOUS
  Filled 2015-04-24: qty 2

## 2015-04-24 MED ORDER — METRONIDAZOLE 500 MG PO TABS: 500.0000 mg | ORAL_TABLET | Freq: Two times a day (BID) | ORAL | Status: DC

## 2015-04-24 MED ORDER — CIPROFLOXACIN HCL 500 MG PO TABS: 500.0000 mg | ORAL_TABLET | Freq: Two times a day (BID) | ORAL | Status: DC

## 2015-04-24 MED ORDER — MORPHINE SULFATE (PF) 4 MG/ML IV SOLN
4.0000 mg | Freq: Once | INTRAVENOUS | Status: AC
  Administered 2015-04-24: 4 mg via INTRAVENOUS
  Filled 2015-04-24: qty 1

## 2015-04-24 MED ORDER — IOHEXOL 350 MG/ML SOLN
100.0000 mL | Freq: Once | INTRAVENOUS | Status: AC | PRN
  Administered 2015-04-24: 100 mL via INTRAVENOUS

## 2015-04-24 MED ORDER — IOHEXOL 300 MG/ML  SOLN
50.0000 mL | Freq: Once | INTRAMUSCULAR | Status: AC | PRN
  Administered 2015-04-24: 50 mL via ORAL

## 2015-04-24 MED ORDER — HYDROMORPHONE HCL 1 MG/ML IJ SOLN
0.5000 mg | Freq: Once | INTRAMUSCULAR | Status: AC
  Administered 2015-04-24: 0.5 mg via INTRAVENOUS
  Filled 2015-04-24: qty 1

## 2015-04-24 MED ORDER — OXYCODONE-ACETAMINOPHEN 5-325 MG PO TABS: 2.0000 | ORAL_TABLET | Freq: Four times a day (QID) | ORAL | Status: DC | PRN

## 2015-04-24 MED FILL — metroNIDAZOLE 500 MG TABS: 500 | 7 days supply | Qty: 14 | Fill #0

## 2015-04-24 MED FILL — CIPROFLOXACIN HCL 500 MG TA: 500 | 5 days supply | Qty: 10 | Fill #0

## 2015-04-24 MED FILL — OXYCODONE/APAP 5-325: 5-325 | 2 days supply | Qty: 10 | Fill #0

## 2015-04-24 NOTE — Telephone Encounter (Signed)
Patient will come in and see Nicoletta Ba PA tomorrow at 11:00

## 2015-04-24 NOTE — ED Notes (Signed)
Pt amb to room 6 with quick steady gait in nad. Pt reports sudden onset of ruq abd pain "stabbing". Denies any n/v/d, last bm last night states was difficult to pass, and did not have her usual bm this am. Pt states pain waxes and wanes but is constant, and at times radiates to her back.

## 2015-04-24 NOTE — ED Notes (Signed)
MD at bedside. 

## 2015-04-24 NOTE — ED Notes (Signed)
Dr. Liu at bedside 

## 2015-04-24 NOTE — Discharge Instructions (Signed)
You have inflammation of your upper colon. Take antibiotics as prescribed. It is atypical for diverticulitis to be located there so you will need to follow-up with GI after treatment for re-evaluation and possible colonoscopy.  Return for worsening symptoms, including worsening pain, vomiting and unable to keep down fluids, fevers, or any other symptoms concerning to you  Diverticulitis Diverticulitis is inflammation or infection of small pouches in your colon that form when you have a condition called diverticulosis. The pouches in your colon are called diverticula. Your colon, or large intestine, is where water is absorbed and stool is formed. Complications of diverticulitis can include:  Bleeding.  Severe infection.  Severe pain.  Perforation of your colon.  Obstruction of your colon. CAUSES  Diverticulitis is caused by bacteria. Diverticulitis happens when stool becomes trapped in diverticula. This allows bacteria to grow in the diverticula, which can lead to inflammation and infection. RISK FACTORS People with diverticulosis are at risk for diverticulitis. Eating a diet that does not include enough fiber from fruits and vegetables may make diverticulitis more likely to develop. SYMPTOMS  Symptoms of diverticulitis may include:  Abdominal pain and tenderness. The pain is normally located on the left side of the abdomen, but may occur in other areas.  Fever and chills.  Bloating.  Cramping.  Nausea.  Vomiting.  Constipation.  Diarrhea.  Blood in your stool. DIAGNOSIS  Your health care provider will ask you about your medical history and do a physical exam. You may need to have tests done because many medical conditions can cause the same symptoms as diverticulitis. Tests may include:  Blood tests.  Urine tests.  Imaging tests of the abdomen, including X-rays and CT scans. When your condition is under control, your health care provider may recommend that you have a  colonoscopy. A colonoscopy can show how severe your diverticula are and whether something else is causing your symptoms. TREATMENT  Most cases of diverticulitis are mild and can be treated at home. Treatment may include:  Taking over-the-counter pain medicines.  Following a clear liquid diet.  Taking antibiotic medicines by mouth for 7-10 days. More severe cases may be treated at a hospital. Treatment may include:  Not eating or drinking.  Taking prescription pain medicine.  Receiving antibiotic medicines through an IV tube.  Receiving fluids and nutrition through an IV tube.  Surgery. HOME CARE INSTRUCTIONS   Follow your health care provider's instructions carefully.  Follow a full liquid diet or other diet as directed by your health care provider. After your symptoms improve, your health care provider may tell you to change your diet. He or she may recommend you eat a high-fiber diet. Fruits and vegetables are good sources of fiber. Fiber makes it easier to pass stool.  Take fiber supplements or probiotics as directed by your health care provider.  Only take medicines as directed by your health care provider.  Keep all your follow-up appointments. SEEK MEDICAL CARE IF:   Your pain does not improve.  You have a hard time eating food.  Your bowel movements do not return to normal. SEEK IMMEDIATE MEDICAL CARE IF:   Your pain becomes worse.  Your symptoms do not get better.  Your symptoms suddenly get worse.  You have a fever.  You have repeated vomiting.  You have bloody or black, tarry stools. MAKE SURE YOU:   Understand these instructions.  Will watch your condition.  Will get help right away if you are not doing well or get  worse.   This information is not intended to replace advice given to you by your health care provider. Make sure you discuss any questions you have with your health care provider.   Document Released: 11/18/2004 Document Revised:  02/13/2013 Document Reviewed: 01/03/2013 Elsevier Interactive Patient Education [employer].

## 2015-04-24 NOTE — ED Notes (Signed)
Patient transported to and from radiology department via stretcher. 

## 2015-04-24 NOTE — ED Notes (Signed)
Another RN to obtain IV for CT

## 2015-04-24 NOTE — ED Notes (Signed)
Dr. Oleta Mouse.

## 2015-04-24 NOTE — ED Notes (Signed)
Patient transported to X-ray 

## 2015-04-24 NOTE — ED Provider Notes (Signed)
CSN: [id number]     Arrival date & time 04/24/15  [id number] History   First MD Initiated Contact with Patient 04/24/15 [phone]     Chief Complaint  Patient presents with  . Abdominal Pain     (Consider location/radiation/quality/duration/timing/severity/associated sxs/prior Treatment) HPI 45 year old female who presents with right upper quadrant abdominal pain. History of hernia repair and cesarean section. States that yesterday afternoon developed intermittent right upper quadrant and epigastric abdominal pain. First occurred during lunch, which subsequently resolved. States that she had typical dinner of fish, and woke up at 2:30 AM with severe right upper quadrant abdominal pain. Denies any nausea, vomiting, fevers or chills. States that she normally has loose stools at baseline, but seems more constipated recently. Pain has been severe, sharp, unremitting since this morning, and she presents to the ED for evaluation. Denies abnormal vaginal bleeding, vaginal discharge, dysuria or urinary frequency, chest pain or shortness of breath. Past Medical History  Diagnosis Date  . SAB (spontaneous abortion) 2007  . Type B blood, Rh positive    Past Surgical History  Procedure Laterality Date  . Laser ablation of the cervix  2005    cin I  . Hernia repair  2006  . Cesarean section  2009   Family History  Problem Relation Age of Onset  . Diabetes Maternal Grandfather   . Hypertension Maternal Grandfather    Social History  Substance Use Topics  . Smoking status: Former Research scientist (life sciences)  . Smokeless tobacco: None     Comment: QUIT 15 YEARS AGO  . Alcohol Use: 0.0 oz/week    0 Standard drinks or equivalent per week   OB History    Gravida Para Term Preterm AB TAB SAB Ectopic Multiple Living   2 1 1  0 1 0 0 0 0 1     Review of Systems 10/14 systems reviewed and are negative other than those stated in the HPI    Allergies  Aspirin; Augmentin; Celexa; and Other  Home Medications   Prior to  Admission medications   Medication Sig Start Date End Date Taking? Authorizing Provider  ciprofloxacin (CIPRO) 500 MG tablet Take 1 tablet (500 mg total) by mouth every 12 (twelve) hours. 04/24/15   Forde Dandy, MD  metroNIDAZOLE (FLAGYL) 500 MG tablet Take 1 tablet (500 mg total) by mouth 2 (two) times daily. 04/24/15   Forde Dandy, MD  NON FORMULARY STRESS VITAMIN 1 TABS PO QD    Historical Provider, MD  oxyCODONE-acetaminophen (PERCOCET/ROXICET) 5-325 MG tablet Take 2 tablets by mouth every 6 (six) hours as needed for moderate pain or severe pain. 04/24/15   Forde Dandy, MD   BP 137/90 mmHg  Pulse 100  Temp(Src) 99.3 F (37.4 C) (Oral)  Resp 18  Ht 5\' 5"  (1.651 m)  Wt 165 lb (74.844 kg)  BMI 27.46 kg/m2  SpO2 100%  LMP 03/27/2015 Physical Exam Physical Exam  Nursing note and vitals reviewed. Constitutional: Well developed, well nourished, non-toxic, and in no acute distress Head: Normocephalic and atraumatic.  Mouth/Throat: Oropharynx is clear and moist.  Neck: Normal range of motion. Neck supple.  Cardiovascular: Normal rate and regular rhythm.   Pulmonary/Chest: Effort normal and breath sounds normal.  Abdominal: Soft. No distension. Epigastric and RUQ tenderness to palpation. There is no rebound and no guarding.  Musculoskeletal: Normal range of motion.  Neurological: Alert, no facial droop, fluent speech, moves all extremities symmetrically Skin: Skin is warm and dry.  Psychiatric: Cooperative  ED Course  Procedures (including critical care time) Labs Review Labs Reviewed  URINALYSIS, ROUTINE W REFLEX MICROSCOPIC (NOT AT [facility]) - Abnormal; Notable for the following:    APPearance CLOUDY (*)    Hgb urine dipstick TRACE (*)    Leukocytes, UA MODERATE (*)    All other components within normal limits  COMPREHENSIVE METABOLIC PANEL - Abnormal; Notable for the following:    Glucose, Bld 102 (*)    Calcium 8.8 (*)    Total Bilirubin 1.3 (*)    All other components within  normal limits  URINE MICROSCOPIC-ADD ON - Abnormal; Notable for the following:    Squamous Epithelial / LPF 0-5 (*)    Bacteria, UA MANY (*)    All other components within normal limits  PREGNANCY, URINE  CBC WITH DIFFERENTIAL/PLATELET  LIPASE, BLOOD  AFP TUMOR MARKER    Imaging Review Ct Abd Wo & W Cm  04/24/2015  CLINICAL DATA:  RIGHT upper quadrant pain. EXAM: CT ABDOMEN WITHOUT AND WITH CONTRAST TECHNIQUE: Multidetector CT imaging of the abdomen was performed following the standard protocol before and following the bolus administration of intravenous contrast. CONTRAST:  57mL OMNIPAQUE IOHEXOL 300 MG/ML SOLN, 126mL OMNIPAQUE IOHEXOL 350 MG/ML SOLN COMPARISON:  Ultrasound 04/24/2015, CT all 02/17/2014 FINDINGS: Lower chest:  Lung bases are clear. Hepatobiliary: Cluster of 3 enhancing lesions in the posterior superior aspect of the RIGHT hepatic lobe measure 1 to 1.5 cm (image 31 221 of series 3). Larger peripheral enhancing lesion in the lateral LEFT hepatic lobe measuring 6.5 cm has imaging characteristics typical of hemangioma. Normal gallbladder. Pancreas: Normal pancreatic parenchymal intensity. No ductal dilatation or inflammation. Spleen: Normal spleen. Adrenals/urinary tract: Adrenal glands and kidneys are normal. Stomach/Bowel: Stomach and limited view of the small bowel are normal. There is an 8 cm segment of circumferential bowel wall thickening involving the proximal transverse colon (image 7, series 3). There is pericolonic inflammation associated with bowel wall thickening. Additionally there is rounded perturberance from the bowel wall measuring 1 cm favored to represent inflamed diverticulum. No perforation or abscess. Vascular/Lymphatic: Abdominal aorta is normal caliber. No retroperitoneal adenopathy. No periportal adenopathy. Musculoskeletal: No aggressive osseous lesion IMPRESSION: 1. Long segment of proximal transverse colon mucosal thickening with pericolonic inflammation and a  suspected inflamed diverticulum is concerning for acute diverticulitis. The long segment of bowel wall thickening is atypical. Recommend follow-up imaging or colonoscopy to exclude underlying neoplasm including lymphoma. 2. Large lesion in LEFT hepatic lobe has imaging characteristics most consistent hemangioma. Smaller lesions within the upper RIGHT hepatic lobe are favored represent hemangiomas also. Further confirmation could be achieved with contrast MRI. These results will be called to the ordering clinician or representative by the Radiologist Assistant, and communication documented in the PACS or zVision Dashboard. Electronically Signed   By: Suzy Bouchard M.D.   On: 04/24/2015 14:35   US Abdomen Limited Ruq  04/24/2015  CLINICAL DATA:  One day history of right upper quadrant pain EXAM: US ABDOMEN LIMITED - RIGHT UPPER QUADRANT COMPARISON:  CT chest which included portions of the liver February 17, 2014 FINDINGS: Gallbladder: Within the gallbladder, there is a 5 mm echogenic focus which neither moves nor shadows, likely a polyp. No echogenic foci which move and shadow or seen in the gallbladder as would be expected with gallstones. There is no gallbladder wall thickening or pericholecystic fluid. No sonographic Murphy sign noted by sonographer. Common bile duct: Diameter: 2 mm. No intrahepatic or extrahepatic biliary duct dilatation. Liver: There is a dominant mass in  the left lobe of the liver measuring 4.9 x 6.0 x 5.8 cm with mildly irregular borders. There is a septated cyst in the posterior segment of the right lobe of the liver measuring 1.1 x 1.3 x 1.0 cm. There is a uniformly hyperechoic 1.5 x 1.7 x 1.6 cm mass near the dome of the liver on the right. There is an immediately adjacent uniformly hyperechoic mass measuring 1.3 x 1.0 x 1.0 cm. There is a uniformly hyperechoic mass adjacent to the right hepatic vein measuring 0.9 x 1.0 x 0.9 cm. Within normal limits in parenchymal echogenicity.  IMPRESSION: Several liver lesions are noted. The smaller lesions appear to represent either small hemangiomas or septated cyst. The dominant mass in the left lobe which was incompletely visualized on prior CT examination as an uncertain etiology. It does not meet criteria for a classic hemangioma. Given these findings, particularly the dominant mass in the left lobe of the liver, nonemergent pre and serial post-contrast MR or CT of the liver advised to further evaluate. There is a 5 mm apparent polyp within the gallbladder. Gallbladder otherwise appears unremarkable. No biliary duct dilatation. Given this finding in the gallbladder, a followup ultrasound of the gallbladder in 1 year to assess for stability would be reasonable. Electronically Signed   By: Lowella Grip III M.D.   On: 04/24/2015 10:01   I have personally reviewed and evaluated these images and lab results as part of my medical decision-making.   EKG Interpretation None      MDM   Final diagnoses:  RUQ pain  Diverticulitis of large intestine without perforation or abscess without bleeding    45 year old female who presents with one day of upper abdominal pain. Afebrile with stable VS. Has soft and nonsurgical abdomen, with RUQ and epigastric tenderness. Overall unremarkable CBC CMP, lipase, UA and pregnancy. RUQ performed to evaluate for biliary pathology showing question of liver mass. CT abd performed for better characterization after discussion with GI, and evidence of just liver hemangioma. However CT also showing etiology of pain from acute diverticulitis involving transverse colon. Atypical location for diverticulitis and dicussed with patient. Has one day follow-up with GI. Started on cipro and flagyl. Discussed signs of worsening infection. Strict return and follow-up instructions reviewed. She/ expressed understanding of all discharge instructions and felt comfortable with the plan of care.     Forde Dandy,  MD 04/24/15 2005

## 2015-04-25 ENCOUNTER — Ambulatory Visit (INDEPENDENT_AMBULATORY_CARE_PROVIDER_SITE_OTHER): Payer: BLUE CROSS/BLUE SHIELD | Admitting: Physician Assistant

## 2015-04-25 ENCOUNTER — Encounter: Payer: Self-pay | Admitting: Physician Assistant

## 2015-04-25 VITALS — BP 124/80 | HR 90 | Ht 65.0 in | Wt 169.0 lb

## 2015-04-25 DIAGNOSIS — R1011 Right upper quadrant pain: Secondary | ICD-10-CM

## 2015-04-25 DIAGNOSIS — R932 Abnormal findings on diagnostic imaging of liver and biliary tract: Secondary | ICD-10-CM

## 2015-04-25 DIAGNOSIS — K5792 Diverticulitis of intestine, part unspecified, without perforation or abscess without bleeding: Secondary | ICD-10-CM

## 2015-04-25 LAB — AFP TUMOR MARKER: AFP TUMOR MARKER: 4.5 ng/mL (ref 0.0–8.3)

## 2015-04-25 MED ORDER — CIPROFLOXACIN HCL 500 MG PO TABS: 500.0000 mg | ORAL_TABLET | Freq: Two times a day (BID) | ORAL | Status: DC

## 2015-04-25 MED ORDER — LORAZEPAM 0.5 MG PO TABS: ORAL_TABLET | ORAL | Status: DC

## 2015-04-25 MED ORDER — METRONIDAZOLE 500 MG PO TABS: 500.0000 mg | ORAL_TABLET | Freq: Three times a day (TID) | ORAL | Status: DC

## 2015-04-25 NOTE — Patient Instructions (Addendum)
Take Cipro 500 mg twice daily x 14 days- (we are sending a script for 4 more days for both the Cipro and Flagyl. )[facility] [place]. Take Flagyl 500 mg twice daily x 14 days Take a probiotic daily. We have also sent Ativan, 2 tablets.  You can take 1 tabl 30  Min before your appointment.   Call us for any worsening pain or if the pain has not resolved when you finish the antibiotics.  You have been scheduled for a colonoscopy. Please follow written instructions given to you at your visit today.  Please pick up your prep supplies at the pharmacy within the next 1-3 days. If you use inhalers (even only as needed), please bring them with you on the day of your procedure. Your physician has requested that you go to www.startemmi.com and enter the access code given to you at your visit today. This web site gives a general overview about your procedure. However, you should still follow specific instructions given to you by our office regarding your preparation for the procedure.  You have been scheduled for an MRI at [facility] on Thursday 3-9. Your appointment time is 9:00 PM. Please arrive at 8:30 am to your appointment time for registration purposes. Please make certain not to have anything to eat or drink 6 hours prior to your test. In addition, if you have any metal in your body, have a pacemaker or defibrillator, please be sure to let your ordering physician know. This test typically takes 45 minutes to 1 hour to complete.

## 2015-04-25 NOTE — Progress Notes (Signed)
Patient ID: [name], female   DOB: 09/15/1970, 45 y.o.   MRN: [id number]   Subjective:    Patient ID: [name], female    DOB: Apr 19, 1970, 45 y.o.   MRN: [id number]  HPI [name] is a pleasant 45 year old white female new to GI today referred by [facility] Dr. Viviana Simpler for evaluation of acute right upper quadrant abdominal pain and abnormal imaging. Patient has not had any prior GI issues and no prior GI evaluation. She states she developed acute onset of right upper abdominal pain about 2:30 PM on Wednesday 1 2017. She says the pain was constant and gradually worsened. She went to bed that evening and was awakened with severe pain about 2:30 AM she was up the rest of the night. She went to the emergency room yesterday for evaluation. She says she had not had any nausea or vomiting no associated fever or chills. No radiation of pain into her back test etc. No dysuria. She had noted that she been a bit constipated and was having to strain some for bowel movements for about a week prior to acute onset. Eval in the emergency room with unremarkable labs. Upper abdominal ultrasound was initially done showing a dominant mass in the left lobe of the liver measuring 4.9 x 6 x 5.8 cm and several other smaller liver lesions. The large lesion was not felt definitely meet the criteria for hemangioma. There was also a 5 mm gallbladder polyp noted. No biliary ductal dilation. [name] Subsequent CT of the abdomen and pelvis showed a long segment of proximal transverse colon thickening and suspected inflamed diverticulum. This area measured about 8 cm. Could not exclude underlying neoplasm or lymphoma. He also documented a 6.5 cm left hepatic lobe lesion most consistent with a hemangioma and a smaller lesion in the right hepatic lobe tapering but not definite for hemangioma. MRI was recommended for confirmation. Patient had never been told that she had any sort of liver lesion or hemangioma.    She did take birth control pills for at least 10 years, is no longer on hormones. Review of her records shows a CT in June of the chest done in 2015 mentioned a 5.9 cm left hepatic lobe lesion most consistent with a hemangioma  Review of Systems Pertinent positive and negative review of systems were noted in the above HPI section.  All other review of systems was otherwise negative.  Outpatient Encounter Prescriptions as of 04/25/2015  Medication Sig  . ciprofloxacin (CIPRO) 500 MG tablet Take 1 tablet (500 mg total) by mouth every 12 (twelve) hours.  . metroNIDAZOLE (FLAGYL) 500 MG tablet Take 1 tablet (500 mg total) by mouth 2 (two) times daily.  . NON FORMULARY STRESS VITAMIN 1 TABS PO QD  . oxyCODONE-acetaminophen (PERCOCET/ROXICET) 5-325 MG tablet Take 2 tablets by mouth every 6 (six) hours as needed for moderate pain or severe pain.  . ciprofloxacin (CIPRO) 500 MG tablet Take 1 tablet (500 mg total) by mouth 2 (two) times daily.  Marland Kitchen LORazepam (ATIVAN) 0.5 MG tablet Take 1 tab 30 min before the appointment.  . metroNIDAZOLE (FLAGYL) 500 MG tablet Take 1 tablet (500 mg total) by mouth 3 (three) times daily.   No facility-administered encounter medications on file as of 04/25/2015.   Allergies  Allergen Reactions  . Aspirin   . Augmentin [Amoxicillin-Pot Clavulanate] Other (See Comments)    PT REQUEST NOT TO TAKE THIS MED  . Celexa [Citalopram]   . Other  HAYFEVER   Patient Active Problem List   Diagnosis Date Noted  . Anxiety and depression 06/18/2011  . 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 Marital Status: Married    Spouse Name: N/A  . Number of Children: N/A  . Years of Education: N/A   Occupational History  . Not on file.   Social History Main Topics  . Smoking status: Former Research scientist (life sciences)  . Smokeless tobacco: Never Used     Comment: QUIT 15 YEARS AGO  . Alcohol Use: 0.0 oz/week    0 Standard drinks or equivalent per  week  . Drug Use: No  . Sexual Activity: No     Comment: DECLINED INSURANCE QUESTIONS   Other Topics Concern  . Not on file   Social History Narrative    [name]'s family history includes Diabetes in her maternal grandfather; Hypertension in her maternal grandfather.      Objective:    Filed Vitals:   04/25/15 1109  BP: 124/80  Pulse: 90    Physical Exam  well-developed white female in no acute distress, blood pressure 124/80 pulse 90 height 5 foot 5 weight 169. HEENT; nontraumatic normocephalic EOMI PERRLA sclera anicteric, Cardiovascular; regular rate and rhythm with S1-S2 no murmur or gallop, Pulmonary; clear bilaterally, Abdomen; soft she is quite tender in the right upper quadrant  and epigastrium ,there is no guarding or rebound no palpable mass or hepatosplenomegaly bowel sounds are present, Rectal; exam not done, Extremities; no clubbing cyanosis or edema skin warm and dry, Neuropsych ;mood and affect appropriate     Assessment & Plan:   #95 45 -year-old female with acute onset of severe right upper quadrant abdominal pain 48 hours ago. 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 Other smaller right hepatic lobe lesions most consistent but not definite for hemangiomas  Plan; patient has been started on Cipro 500 by mouth twice a day and Flagyl 500 by mouth twice a day we will have her complete a 14 day course Add probiotic daily over the next month Hydrocodone every 6 hours when necessary Full liquid to soft diet until pain improves, home to rest Will schedule for colonoscopy with Dr. Loletha Carrow  in 4-5 weeks to allow adequate time for acute inflammatory process to resolve. She is advised that if she continues to have  pain when she finishes the antibiotics that she needs to call and will need to be reevaluated. Also advised that if her pain worsens at any point she will need to be reevaluated. Colonoscopy discussed in detail with patient and she is agreeable to proceed We'll schedule for MRI of the liver to confirm that the lesion seen on CT scan are indeed hemangiomas. One lesion is large and will need to be followed serially and if increasing in size consider ablation/resection   Amy S Esterwood PA-C 04/25/2015   Cc: Ardith Dark, PA-C

## 2015-04-28 ENCOUNTER — Telehealth: Payer: Self-pay | Admitting: Physician Assistant

## 2015-04-28 NOTE — Telephone Encounter (Signed)
Patient calling back regarding this.  °

## 2015-04-28 NOTE — Telephone Encounter (Signed)
Patient started with a burning sensation on the back of her neck. She put cool compresses on it. She states she is prove to hives when she is sick. This did not improve. She looked and discovered a cluster of small blisters in a circular pattern on the back of her neck. It is not unilateral. She does not have this anywhere else on her body. She developed the diarrhea after she was seen in the Urgent Care. She stopped taking any of the pain medication Friday. She has not stopped her ATB's. She has not taken Benadryl because she is uncertain if it will help.  Please advise.

## 2015-04-28 NOTE — Progress Notes (Signed)
Thank you for sending this case to me. I have reviewed the entire note, and the outlined plan seems appropriate.  

## 2015-04-28 NOTE — Telephone Encounter (Signed)
I am not sure that a small cluster of blisters is an antibiotic RXn - usually  a more diffuse rash- how is her abdominal pain?... Would like her to take the abx for a minimum of 7 days. She can try benadryl . Let me know how she is doing with the abdominal pain- hopefully better

## 2015-04-28 NOTE — Telephone Encounter (Signed)
Patient instructed. She has gone back to work, but says she is still having abdominal pain and diarrhea. She is not taking the pain medication because it causes extreme nausea. She will encourage fluids and broths to maintain hydration . Stay in touch with [place] on her progress.

## 2015-05-01 ENCOUNTER — Ambulatory Visit (HOSPITAL_COMMUNITY)
Admission: RE | Admit: 2015-05-01 | Discharge: 2015-05-01 | Disposition: A | Discharge status: Home / Self Care | Payer: BLUE CROSS/BLUE SHIELD | Source: Ambulatory Visit | Attending: Physician Assistant | Admitting: Physician Assistant

## 2015-05-01 ENCOUNTER — Other Ambulatory Visit: Payer: Self-pay | Admitting: Physician Assistant

## 2015-05-01 DIAGNOSIS — R1011 Right upper quadrant pain: Secondary | ICD-10-CM

## 2015-05-01 DIAGNOSIS — R932 Abnormal findings on diagnostic imaging of liver and biliary tract: Secondary | ICD-10-CM

## 2015-05-01 DIAGNOSIS — K5792 Diverticulitis of intestine, part unspecified, without perforation or abscess without bleeding: Secondary | ICD-10-CM

## 2015-05-02 ENCOUNTER — Telehealth: Payer: Self-pay | Admitting: Physician Assistant

## 2015-05-02 NOTE — Telephone Encounter (Signed)
Spoke with the patient. She would like to put the MRI off until after the colonoscopy. She is scheduled for 06/16/15. The MRI is to follow up on an abnormal CT. ? hemangiomas of the liver. She is advancing her diet without difficulty. She is pain free presently. Completing the ATB's as ordered.  Is it okay to wait on the MRI?

## 2015-05-02 NOTE — Telephone Encounter (Signed)
I have left message for the patient to call back. MRI was to look closer at the liver. She can advance her diet as tolerated.

## 2015-05-02 NOTE — Telephone Encounter (Signed)
patient states that she was told that she was so dehyrated that the nurse could not get needle in to administer contrast for last night's MRI. She is wanting to know why she is needing to have MRI? patient is also wanting to know when she can start "introducing' new foods into her diet?

## 2015-05-04 NOTE — Telephone Encounter (Signed)
Yes, that is fine. 

## 2015-05-05 ENCOUNTER — Telehealth: Payer: Self-pay | Admitting: Physician Assistant

## 2015-05-05 NOTE — Telephone Encounter (Signed)
Ok- hard to know which antibiotic caused rxn- as long as her pain is gone she doesn't need any more antibiotics- sorry she is having such a hard time.

## 2015-05-05 NOTE — Telephone Encounter (Signed)
Patient is aware 

## 2015-05-05 NOTE — Telephone Encounter (Signed)
FYI- developed full body hives and LE edema. Stopped both Cipro and Flagyl after 8 days. She is improving. Added these meds to her allergy list.

## 2015-06-16 ENCOUNTER — Encounter: Payer: Self-pay | Admitting: Gastroenterology

## 2015-06-16 ENCOUNTER — Ambulatory Visit (AMBULATORY_SURGERY_CENTER): Payer: BLUE CROSS/BLUE SHIELD | Admitting: Gastroenterology

## 2015-06-16 VITALS — BP 107/65 | HR 71 | Temp 98.2°F | Resp 18

## 2015-06-16 DIAGNOSIS — K5792 Diverticulitis of intestine, part unspecified, without perforation or abscess without bleeding: Secondary | ICD-10-CM | POA: Diagnosis not present

## 2015-06-16 DIAGNOSIS — R932 Abnormal findings on diagnostic imaging of liver and biliary tract: Secondary | ICD-10-CM

## 2015-06-16 MED ORDER — SODIUM CHLORIDE 0.9 % IV SOLN: 500.0000 mL | INTRAVENOUS | Status: DC

## 2015-06-16 NOTE — Op Note (Signed)
[place] Patient Name: [name] Procedure Date: 06/16/2015 1:20 PM MRN: [id number] Endoscopist: Mallie Mussel L. Loletha Carrow , MD Age: 45 Date of Birth: 11/25/1970 Gender: Female Procedure:                Colonoscopy Indications:              Abdominal pain in the right upper quadrant, single                            episode with hepatic flexure inflammation on CT scan Medicines:                Monitored Anesthesia Care Procedure:                Pre-Anesthesia Assessment:                           - Prior to the procedure, a History and Physical                            was performed, and patient medications and                            allergies were reviewed. The patient's tolerance of                            previous anesthesia was also reviewed. The risks                            and benefits of the procedure and the sedation                            options and risks were discussed with the patient.                            All questions were answered, and informed consent                            was obtained. Prior Anticoagulants: The patient has                            taken no previous anticoagulant or antiplatelet                            agents. ASA Grade Assessment: I - A normal, healthy                            patient. After reviewing the risks and benefits,                            the patient was deemed in satisfactory condition to                            undergo the procedure.  After obtaining informed consent, the colonoscope                            was passed under direct vision. Throughout the                            procedure, the patient's blood pressure, pulse, and                            oxygen saturations were monitored continuously. The                            Model CF-HQ190L [id number]) scope was introduced                            through the anus and advanced to the the cecum,                          identified by appendiceal orifice and ileocecal                            valve. The colonoscopy was performed without                            difficulty. The patient tolerated the procedure                            well. The quality of the bowel preparation was                            excellent. The ileocecal valve, appendiceal                            orifice, and rectum were photographed. Scope In: 1:37:21 PM Scope Out: 1:50:45 PM Scope Withdrawal Time: 0 hours 8 minutes 28 seconds  Total Procedure Duration: 0 hours 13 minutes 24 seconds  Findings:                 The perianal and digital rectal examinations were                            normal.                           Multiple small-mouthed diverticula were found from                            sigmoid to hepatic flexure.                           The exam was otherwise without abnormality on                            direct and retroflexion views. Complications:            No immediate complications. Estimated Blood Loss:  Estimated blood loss: none. Impression:               - Diverticulosis from sigmoid to hepatic flexure.                           - The examination was otherwise normal on direct                            and retroflexion views.                           - No specimens collected.                           - The patient appears to have had an episode of                            diverticulitis, albeit in an atypical location. Recommendation:           - Patient has a contact number available for                            emergencies. The signs and symptoms of potential                            delayed complications were discussed with the                            patient. Return to normal activities tomorrow.                            Written discharge instructions were provided to the                            patient.                           - Resume previous  diet.                           - Continue present medications.                           - Repeat colonoscopy in 10 years for screening                            purposes. Takeru Bose L. Loletha Carrow, MD 06/16/2015 1:56:08 PM This report has been signed electronically.

## 2015-06-16 NOTE — Patient Instructions (Signed)

## 2015-06-16 NOTE — Progress Notes (Signed)
Pt reports chronic pain in left shoulder, positions self to LLD and reports being comfortable

## 2015-06-16 NOTE — Progress Notes (Signed)
A/ox3 pleased with MAC, report to Jane RN 

## 2015-06-17 ENCOUNTER — Telehealth: Payer: Self-pay | Admitting: *Deleted

## 2015-06-17 NOTE — Telephone Encounter (Signed)
  Follow up Call-  Call back number 06/16/2015  Post procedure Call Back phone  # [phone]  Permission to leave phone message Yes     Patient questions:  Do you have a fever, pain , or abdominal swelling? No. Pain Score  0 *  Have you tolerated food without any problems? Yes.    Have you been able to return to your normal activities? Yes.    Do you have any questions about your discharge instructions: Diet   No. Medications  No. Follow up visit  No.  Do you have questions or concerns about your Care? No.  Actions: * If pain score is 4 or above: No action needed, pain <4.

## 2016-07-07 ENCOUNTER — Encounter: Payer: Self-pay | Admitting: Gynecology

## 2018-01-23 IMAGING — CT CT ABDOMEN WO/W CM
2 of 9 series · 11 of 46 positions shown, 17 images · IV contrast (omnipaque)
Comparison: Ultrasound 04/24/2015, CT all 02/17/2014

CLINICAL DATA: RIGHT upper quadrant pain.

EXAM:
CT ABDOMEN WITHOUT AND WITH CONTRAST
TECHNIQUE: Multidetector CT imaging of the abdomen was performed following the
standard protocol before and following the bolus administration of
intravenous contrast.
CONTRAST:  50mL OMNIPAQUE IOHEXOL 300 MG/ML SOLN, 100mL OMNIPAQUE
IOHEXOL 350 MG/ML SOLN

[Series 4: coronal arterial · coronal · arterial · 0.69mm/px · 3 of 97 slices shown, 4 images]
[im 25/97  soft-tissue]
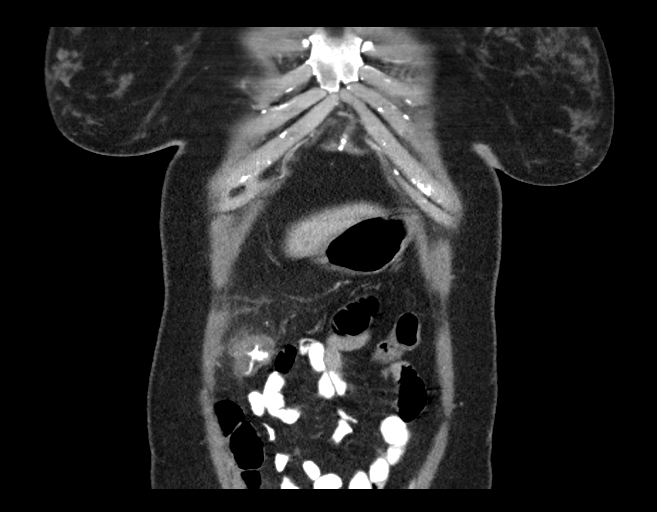
[im 49/97  soft-tissue]
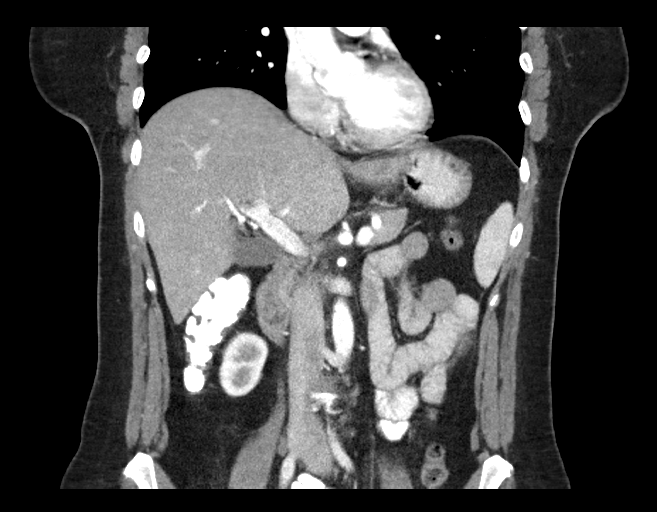
[im 49/97  bone]
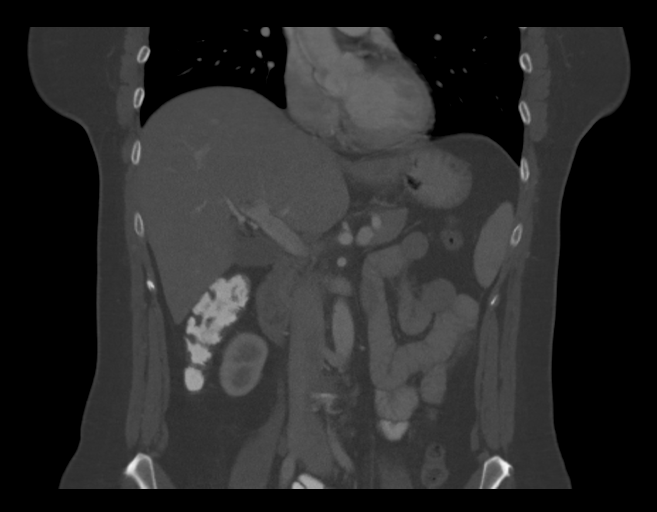
[im 73/97  soft-tissue]
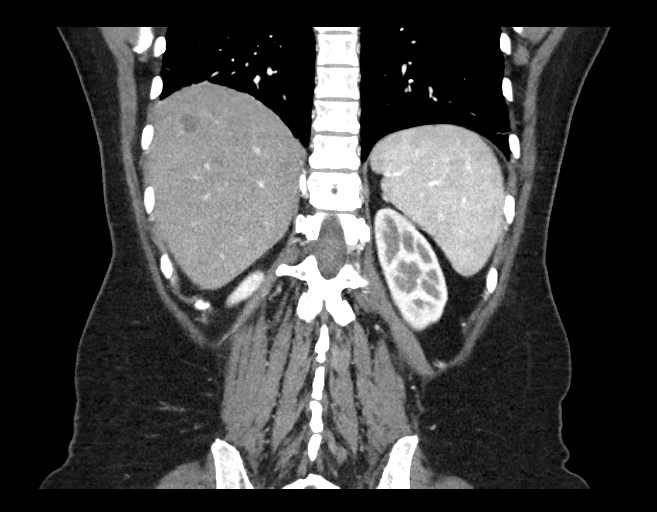

[Series 7: axial venous · axial · portal-venous · 0.86mm/px · z∈[-244,+2]mm · 8 of 106 slices shown, 13 images]
[im 12/106  soft-tissue]
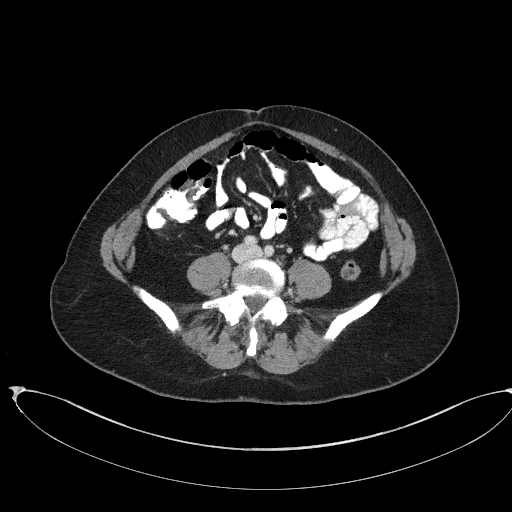
[im 12/106  bone]
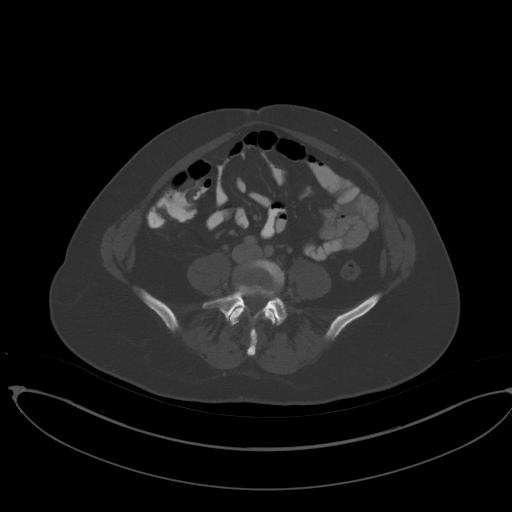
[im 24/106  soft-tissue]
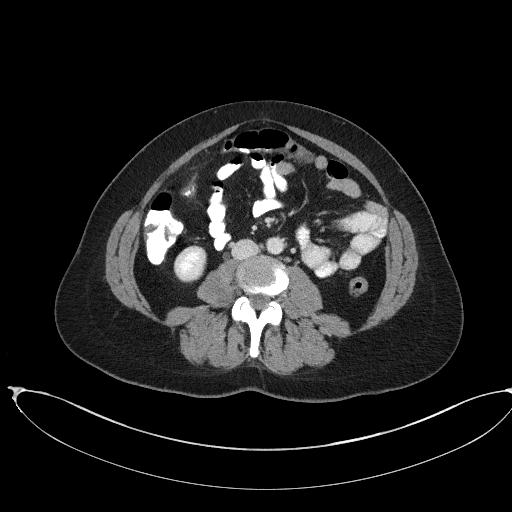
[im 36/106  soft-tissue]
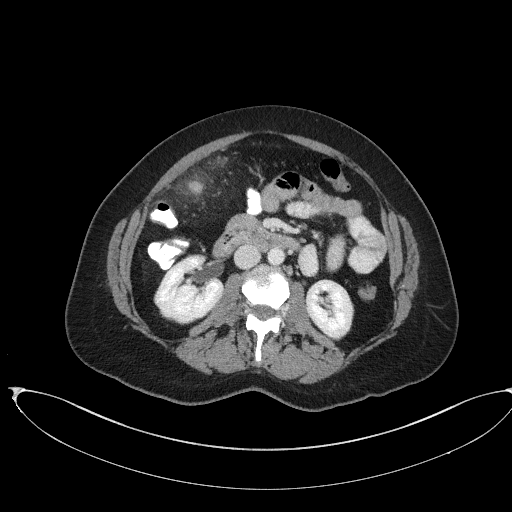
[im 47/106  soft-tissue]
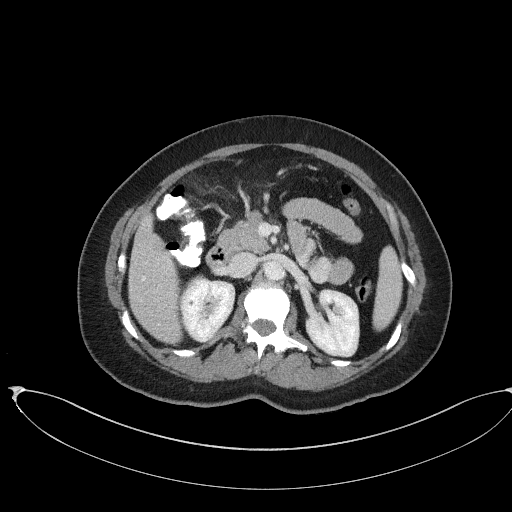
[im 59/106  soft-tissue]
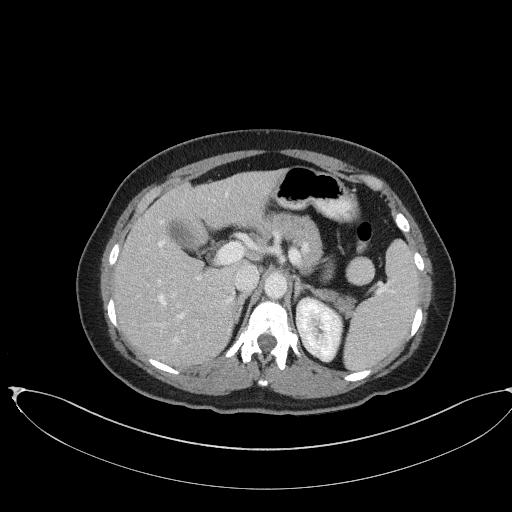
[im 59/106  lung]
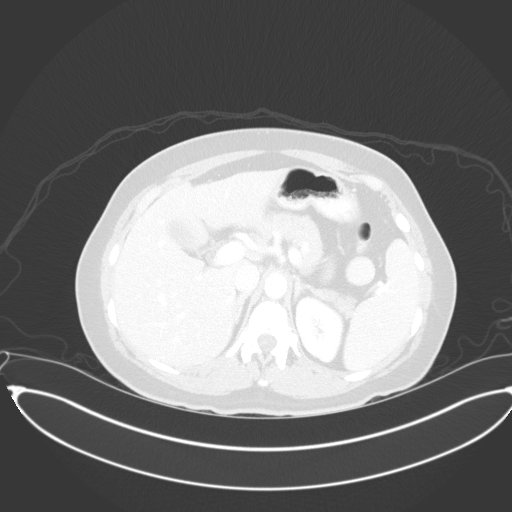
[im 71/106  soft-tissue]
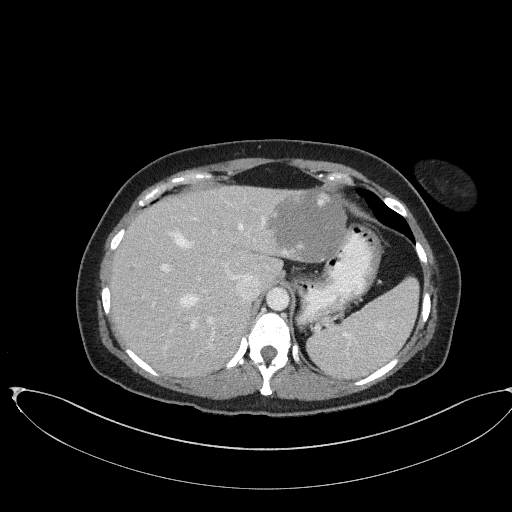
[im 71/106  lung]
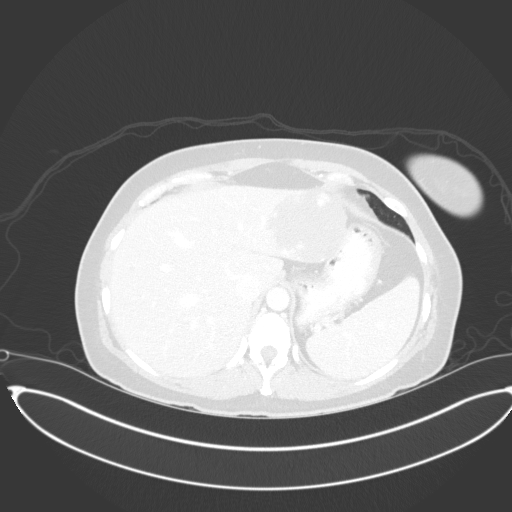
[im 82/106  soft-tissue]
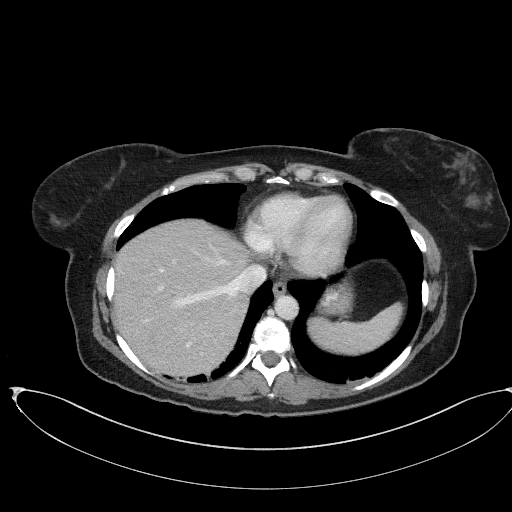
[im 82/106  lung]
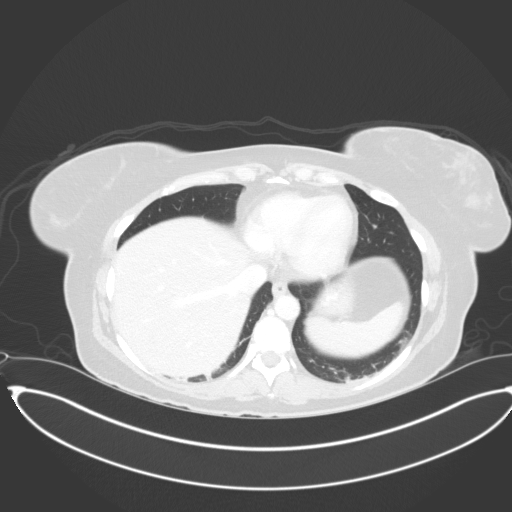
[im 94/106  soft-tissue]
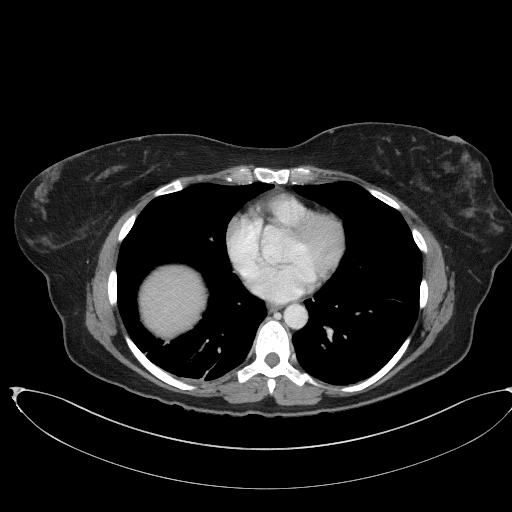
[im 94/106  lung]
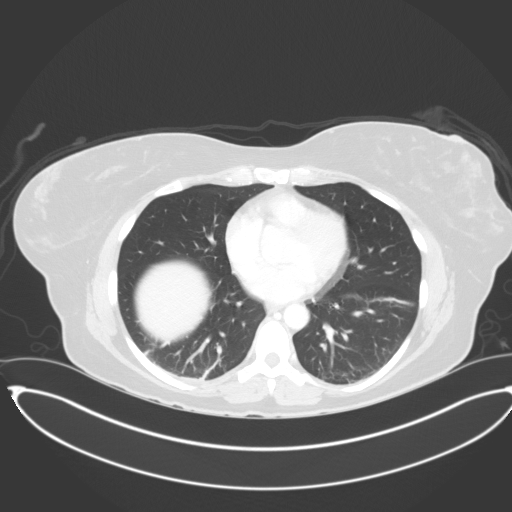

[11 of 46 positions shown; findings below may reference images not displayed]

FINDINGS: Lower chest:  Lung bases are clear.

Hepatobiliary: Cluster of 3 enhancing lesions in the posterior
superior aspect of the RIGHT hepatic lobe measure 1 to 1.5 cm (image
31 221 of series 3). Larger peripheral enhancing lesion in the
lateral LEFT hepatic lobe measuring 6.5 cm has imaging
characteristics typical of hemangioma. Normal gallbladder.

Pancreas: Normal pancreatic parenchymal intensity. No ductal
dilatation or inflammation.

Spleen: Normal spleen.

Adrenals/urinary tract: Adrenal glands and kidneys are normal.

Stomach/Bowel: Stomach and limited view of the small bowel are
normal. There is an 8 cm segment of circumferential bowel wall
thickening involving the proximal transverse colon (image 7, series
3). There is pericolonic inflammation associated with bowel wall
thickening. Additionally there is rounded perturberance from the
bowel wall measuring 1 cm favored to represent inflamed
diverticulum. No perforation or abscess.

Vascular/Lymphatic: Abdominal aorta is normal caliber. No
retroperitoneal adenopathy. No periportal adenopathy.

Musculoskeletal: No aggressive osseous lesion
IMPRESSION: 1. Long segment of proximal transverse colon mucosal thickening with
pericolonic inflammation and a suspected inflamed diverticulum is
concerning for acute diverticulitis. The long segment of bowel wall
thickening is atypical. Recommend follow-up imaging or colonoscopy
to exclude underlying neoplasm including lymphoma.
2. Large lesion in LEFT hepatic lobe has imaging characteristics
most consistent hemangioma. Smaller lesions within the upper RIGHT
hepatic lobe are favored represent hemangiomas also. Further
confirmation could be achieved with contrast MRI.
These results will be called to the ordering clinician or
representative by the Radiologist Assistant, and communication
documented in the PACS or zVision Dashboard.

## 2019-03-30 DIAGNOSIS — E559 Vitamin D deficiency, unspecified: Secondary | ICD-10-CM | POA: Insufficient documentation

## 2022-01-12 ENCOUNTER — Encounter (HOSPITAL_COMMUNITY): Payer: Self-pay | Admitting: *Deleted

## 2022-01-12 ENCOUNTER — Ambulatory Visit (HOSPITAL_COMMUNITY)
Admission: EM | Admit: 2022-01-12 | Discharge: 2022-01-12 | Disposition: A | Discharge status: Home / Self Care | Payer: BC Managed Care – PPO | Attending: Internal Medicine | Admitting: Internal Medicine

## 2022-01-12 DIAGNOSIS — R058 Other specified cough: Secondary | ICD-10-CM | POA: Diagnosis not present

## 2022-01-12 DIAGNOSIS — J069 Acute upper respiratory infection, unspecified: Secondary | ICD-10-CM | POA: Diagnosis present

## 2022-01-12 DIAGNOSIS — J029 Acute pharyngitis, unspecified: Secondary | ICD-10-CM | POA: Diagnosis not present

## 2022-01-12 DIAGNOSIS — Z1152 Encounter for screening for COVID-19: Secondary | ICD-10-CM | POA: Insufficient documentation

## 2022-01-12 DIAGNOSIS — R0981 Nasal congestion: Secondary | ICD-10-CM | POA: Insufficient documentation

## 2022-01-12 HISTORY — DX: Hemangioma of intra-abdominal structures: D18.03

## 2022-01-12 HISTORY — DX: Diverticulitis of intestine, part unspecified, without perforation or abscess without bleeding: K57.92

## 2022-01-12 LAB — RESP PANEL BY RT-PCR (FLU A&B, COVID) ARPGX2
Influenza A by PCR: NEGATIVE
Influenza B by PCR: NEGATIVE
SARS Coronavirus 2 by RT PCR: NEGATIVE

## 2022-01-12 MED ORDER — BENZONATATE 100 MG PO CAPS: 100.0000 mg | ORAL_CAPSULE | Freq: Three times a day (TID) | ORAL | 0 refills | Status: DC

## 2022-01-12 MED ORDER — GUAIFENESIN ER 1200 MG PO TB12: 1200.0000 mg | ORAL_TABLET | Freq: Two times a day (BID) | ORAL | 0 refills | Status: DC

## 2022-01-12 NOTE — ED Provider Notes (Signed)
[place]    CSN: [id number] Arrival date & time: 01/12/22  1053      History   Chief Complaint Chief Complaint  Patient presents with   Nasal Congestion   Sore Throat   Cough    HPI [name] is a 51 y.o. female.   Patient presents urgent care for evaluation of nasal congestion, malaise, and sore throat that started 5 days ago on January 07, 2022.  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  She has tested 4 times for COVID-19 at home every day since becoming sick and all of the tests were negative.  She is not experiencing much of a cough and states if she does cough, it is because of the drainage in her throat.  Reports head pressure that is generalized due to nasal congestion.  Nasal congestion is thick and makes it difficult to breathe through her nose.  She has been using saline nasal spray to help with symptoms and states this does help sometimes.  She has also been taking TheraFlu but states this is not helping very much.  She is not a smoker and denies drug use.  She is vaccinated against COVID-19 and influenza.  Denies history of chronic respiratory problems.  She is not short of breath, denies chest pain, nausea, vomiting, weakness, dizziness, and abdominal discomfort.  She has not had any fever or chills to her knowledge but states "99.3 is high for her".    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 Authorizing Provider  benzonatate (TESSALON) 100 MG capsule Take 1 capsule (100 mg total) by mouth every 8 (eight) hours. 01/12/22  Yes Talbot Grumbling, FNP  Guaifenesin 1200 MG TB12 Take 1 tablet (1,200 mg total) by mouth in the morning and at bedtime. 01/12/22  Yes Talbot Grumbling, FNP  loratadine (CLARITIN) 10 MG tablet Take 10 mg by mouth daily.    [provider]  LORazepam (ATIVAN) 0.5 MG tablet Take 1 tab 30 min before the appointment. 04/25/15   Esterwood, Amy S, PA-C  NON FORMULARY STRESS VITAMIN 1 TABS PO QD    [provider]  oxyCODONE-acetaminophen (PERCOCET/ROXICET) 5-325 MG tablet Take 2 tablets by mouth every 6 (six) hours as needed for moderate pain or severe pain. Patient not taking: Reported on  06/16/2015 04/24/15   Forde Dandy, MD    Family History Family History  Problem Relation Age of Onset   Diabetes Maternal Grandfather    Hypertension Maternal Grandfather    Heart disease Maternal Grandfather    Colon polyps Father     Social History Social History   Tobacco Use   Smoking status: Former   Smokeless tobacco: Never   Tobacco comments:    QUIT 15 YEARS AGO  Vaping Use   Vaping Use: Never used  Substance Use Topics   Alcohol use: Yes    Alcohol/week: 0.0 standard drinks of alcohol   Drug use: No     Allergies   Aspirin, Augmentin [amoxicillin-pot clavulanate], Celexa [citalopram], Ciprofloxacin, Flagyl [metronidazole], and Other   Review of Systems Review of Systems  Respiratory:  Positive for cough.   Per HPI   Physical Exam Triage Vital Signs ED Triage Vitals  Enc Vitals Group     BP 01/12/22 1205 (!) 169/108     Pulse Rate 01/12/22 1205 82     Resp 01/12/22 1205 18     Temp 01/12/22  1205 99.3 F (37.4 C)     Temp Source 01/12/22 1205 Oral     SpO2 01/12/22 1205 97 %     Weight --      Height --      Head Circumference --      Peak Flow --      Pain Score 01/12/22 1201 0     Pain Loc --      Pain Edu? --      Excl. in [place]? --    No data found.  Updated Vital Signs BP (!) 169/108 (BP Location: Left Arm)   Pulse 82   Temp 99.3 F (37.4 C) (Oral)   Resp 18   LMP 12/16/2021 (Approximate)   SpO2 97%   Visual Acuity Right Eye Distance:   Left Eye Distance:   Bilateral Distance:    Right Eye Near:   Left Eye Near:    Bilateral Near:     Physical Exam Vitals and nursing note reviewed.  Constitutional:      Appearance: She is not ill-appearing or toxic-appearing.  HENT:     Head: Normocephalic and atraumatic.     Right Ear: Hearing, tympanic membrane, ear canal and external ear normal.     Left Ear: Hearing, tympanic membrane, ear canal and external ear normal.     Nose: Congestion present.     Mouth/Throat:     Lips: Pink.     Mouth: Mucous membranes are moist.     Pharynx: No posterior oropharyngeal erythema.     Comments: Small amount of clear postnasal drainage visualized to the posterior oropharynx.  Eyes:     General: Lids are normal. Vision grossly intact. Gaze aligned appropriately.        Right eye: No discharge.        Left eye: No discharge.     Extraocular Movements: Extraocular movements intact.     Conjunctiva/sclera: Conjunctivae normal.     Pupils: Pupils are equal, round, and reactive to light.  Cardiovascular:     Rate and Rhythm: Normal rate and regular rhythm.     Heart sounds: Normal heart sounds, S1 normal and S2 normal.  Pulmonary:     Effort: Pulmonary effort is normal. No respiratory distress.     Breath sounds: Normal breath sounds and air entry. No wheezing, rhonchi or rales.  Musculoskeletal:  Cervical back: Neck supple.  Lymphadenopathy:     Cervical: No cervical adenopathy.  Skin:    General: Skin is warm and  dry.     Capillary Refill: Capillary refill takes less than 2 seconds.     Findings: No rash.  Neurological:     General: No focal deficit present.     Mental Status: She is alert and oriented to person, place, and time. Mental status is at baseline.     Cranial Nerves: No dysarthria or facial asymmetry.  Psychiatric:        Mood and Affect: Mood normal.        Speech: Speech normal.        Behavior: Behavior normal.        Thought Content: Thought content normal.        Judgment: Judgment normal.      UC Treatments / Results  Labs (all labs ordered are listed, but only abnormal results are displayed) Labs Reviewed  RESP PANEL BY RT-PCR (FLU A&B, COVID) [id number]    EKG   Radiology No results found.  Procedures Procedures (including critical care time)  Medications Ordered in UC Medications - No data to display  Initial Impression / Assessment and Plan / UC Course  I have reviewed the triage vital signs and the nursing notes.  Pertinent labs & imaging results that were available during my care of the patient were reviewed by me and considered in my medical decision making (see chart for details).   Viral URI with cough Symptoms and physical exam consistent with a viral upper respiratory tract infection that will likely resolve with rest, fluids, and prescriptions for symptomatic relief. No indication for imaging today based on stable cardiopulmonary exam and hemodynamically stable vital signs. COVID-19 and flu testing is pending per patient request.  We will call patient if this is positive.  Quarantine guidelines discussed. Currently on day 6 of symptoms and does not qualify for antiviral therapy due to length of symptoms.  Guaifenesin 1200 mg every 12 hours and Tessalon Perles every 8 hours sent to pharmacy for symptomatic relief to be taken as prescribed.   Patient to stop taking over-the-counter TheraFlu and take higher dose of guaifenesin instead to help with nasal  congestion and cough.  Continue saline nasal spray to reduce edema to the turbinates.  Warm compresses to the maxillary and frontal sinuses recommended.  May use ibuprofen/tylenol over the counter for body aches, fever/chills, and overall discomfort associated with viral illness. Nonpharmacologic interventions for symptom relief provided and after visit summary below.   Strict ED/urgent care return precautions given.  Patient verbalizes understanding and agreement with plan.  Counseled patient regarding possible side effects and uses of all medications prescribed at today's visit.  Patient verbalizes understanding and agreement with plan.  All questions answered.  Patient discharged from urgent care in stable condition.        Final Clinical Impressions(s) / UC Diagnoses   Final diagnoses:  Viral upper respiratory tract infection  Nasal congestion     Discharge Instructions      Take guaifenesin every 12 hours for nasal congestion. Drink plenty of water when taking this medicine so it works best in your body. We will call you if your COVID-19 or flu testing is positive. Take Tessalon Perles every 8 hours as needed for cough.  Work note was at the end [employer].  If you develop any new or worsening symptoms or do not improve in the next  2 to 3 days, please return.  If your symptoms are severe, please go to the emergency room.  Follow-up with your primary care provider for further evaluation and management of your symptoms as well as ongoing wellness visits.  I hope you feel better!     ED Prescriptions     Medication Sig Dispense Auth. Provider   Guaifenesin 1200 MG TB12 Take 1 tablet (1,200 mg total) by mouth in the morning and at bedtime. 14 tablet Joella Prince M, FNP   benzonatate (TESSALON) 100 MG capsule Take 1 capsule (100 mg total) by mouth every 8 (eight) hours. 21 capsule Talbot Grumbling, FNP      PDMP not reviewed this encounter.   Talbot Grumbling, [place] 01/12/22 1316

## 2022-01-12 NOTE — ED Triage Notes (Signed)
Pt states that she has had sore throat, cough and congestion x 5 days. She works in [employer] and Covid was going around she tested 4 days in a row and all at home test neg. She Thera flu, tylenol server cold and nyquil.

## 2022-01-12 NOTE — Discharge Instructions (Signed)
Take guaifenesin every 12 hours for nasal congestion. Drink plenty of water when taking this medicine so it works best in your body. We will call you if your COVID-19 or flu testing is positive. Take Tessalon Perles every 8 hours as needed for cough.  Work note was at the end [employer].  If you develop any new or worsening symptoms or do not improve in the next 2 to 3 days, please return.  If your symptoms are severe, please go to the emergency room.  Follow-up with your primary care provider for further evaluation and management of your symptoms as well as ongoing wellness visits.  I hope you feel better!

## 2022-09-17 ENCOUNTER — Ambulatory Visit (HOSPITAL_COMMUNITY)
Admission: EM | Admit: 2022-09-17 | Discharge: 2022-09-17 | Disposition: A | Discharge status: Home / Self Care | Payer: BC Managed Care – PPO | Attending: Physician Assistant | Admitting: Physician Assistant

## 2022-09-17 ENCOUNTER — Emergency Department (HOSPITAL_COMMUNITY)
Admission: EM | Admit: 2022-09-17 | Discharge: 2022-09-17 | Disposition: A | Discharge status: Home / Self Care | Payer: BC Managed Care – PPO | Attending: Emergency Medicine | Admitting: Emergency Medicine

## 2022-09-17 ENCOUNTER — Other Ambulatory Visit: Payer: Self-pay

## 2022-09-17 ENCOUNTER — Encounter (HOSPITAL_COMMUNITY): Payer: Self-pay

## 2022-09-17 ENCOUNTER — Emergency Department (HOSPITAL_COMMUNITY): Payer: BC Managed Care – PPO

## 2022-09-17 DIAGNOSIS — M79651 Pain in right thigh: Secondary | ICD-10-CM | POA: Insufficient documentation

## 2022-09-17 DIAGNOSIS — R202 Paresthesia of skin: Secondary | ICD-10-CM | POA: Diagnosis present

## 2022-09-17 DIAGNOSIS — R531 Weakness: Secondary | ICD-10-CM | POA: Insufficient documentation

## 2022-09-17 DIAGNOSIS — R000 Tachycardia, unspecified: Secondary | ICD-10-CM | POA: Diagnosis not present

## 2022-09-17 DIAGNOSIS — Z79899 Other long term (current) drug therapy: Secondary | ICD-10-CM | POA: Diagnosis not present

## 2022-09-17 DIAGNOSIS — I161 Hypertensive emergency: Secondary | ICD-10-CM

## 2022-09-17 DIAGNOSIS — R200 Anesthesia of skin: Secondary | ICD-10-CM | POA: Diagnosis not present

## 2022-09-17 DIAGNOSIS — H538 Other visual disturbances: Secondary | ICD-10-CM | POA: Diagnosis not present

## 2022-09-17 LAB — DIFFERENTIAL
Abs Immature Granulocytes: 0.03 10*3/uL (ref 0.00–0.07)
Basophils Absolute: 0.1 10*3/uL (ref 0.0–0.1)
Basophils Relative: 1 %
Eosinophils Absolute: 0.2 10*3/uL (ref 0.0–0.5)
Eosinophils Relative: 4 %
Immature Granulocytes: 1 %
Lymphocytes Relative: 29 %
Lymphs Abs: 1.6 10*3/uL (ref 0.7–4.0)
Monocytes Absolute: 0.5 10*3/uL (ref 0.1–1.0)
Monocytes Relative: 9 %
Neutro Abs: 3.2 10*3/uL (ref 1.7–7.7)
Neutrophils Relative %: 56 %

## 2022-09-17 LAB — COMPREHENSIVE METABOLIC PANEL
ALT: 17 U/L (ref 0–44)
AST: 17 U/L (ref 15–41)
Albumin: 3.9 g/dL (ref 3.5–5.0)
Alkaline Phosphatase: 61 U/L (ref 38–126)
Anion gap: 10 (ref 5–15)
BUN: 11 mg/dL (ref 6–20)
CO2: 25 mmol/L (ref 22–32)
Calcium: 9.2 mg/dL (ref 8.9–10.3)
Chloride: 105 mmol/L (ref 98–111)
Creatinine, Ser: 0.71 mg/dL (ref 0.44–1.00)
GFR, Estimated: >60 mL/min (ref >60)
Glucose, Bld: 95 mg/dL (ref 70–99)
Potassium: 4 mmol/L (ref 3.5–5.1)
Sodium: 140 mmol/L (ref 135–145)
Total Bilirubin: 0.5 mg/dL (ref 0.3–1.2)
Total Protein: 6.3 g/dL — ABNORMAL LOW (ref 6.5–8.1)

## 2022-09-17 LAB — I-STAT CHEM 8, ED
BUN: 10 mg/dL (ref 6–20)
Calcium, Ion: 1.16 mmol/L (ref 1.15–1.40)
Chloride: 102 mmol/L (ref 98–111)
Creatinine, Ser: 0.7 mg/dL (ref 0.44–1.00)
Glucose, Bld: 93 mg/dL (ref 70–99)
HCT: 37 % (ref 36.0–46.0)
Hemoglobin: 12.6 g/dL (ref 12.0–15.0)
Potassium: 3.6 mmol/L (ref 3.5–5.1)
Sodium: 138 mmol/L (ref 135–145)
TCO2: 24 mmol/L (ref 22–32)

## 2022-09-17 LAB — CBC
HCT: 41.4 % (ref 36.0–46.0)
Hemoglobin: 13.8 g/dL (ref 12.0–15.0)
MCH: 30.7 pg (ref 26.0–34.0)
MCHC: 33.3 g/dL (ref 30.0–36.0)
MCV: 92 fL (ref 80.0–100.0)
Platelets: 284 10*3/uL (ref 150–400)
RBC: 4.5 MIL/uL (ref 3.87–5.11)
RDW: 12.2 % (ref 11.5–15.5)
WBC: 5.7 10*3/uL (ref 4.0–10.5)
nRBC: 0 % (ref 0.0–0.2)

## 2022-09-17 LAB — RAPID URINE DRUG SCREEN, HOSP PERFORMED
Amphetamines: NOT DETECTED
Barbiturates: NOT DETECTED
Benzodiazepines: NOT DETECTED
Cocaine: NOT DETECTED
Opiates: NOT DETECTED
Tetrahydrocannabinol: NOT DETECTED

## 2022-09-17 LAB — URINALYSIS, ROUTINE W REFLEX MICROSCOPIC
Bacteria, UA: NONE SEEN
Bilirubin Urine: NEGATIVE
Glucose, UA: NEGATIVE mg/dL
Hgb urine dipstick: NEGATIVE
Ketones, ur: NEGATIVE mg/dL
Nitrite: NEGATIVE
Protein, ur: NEGATIVE mg/dL
Specific Gravity, Urine: 1.021 (ref 1.005–1.030)
pH: 6 (ref 5.0–8.0)

## 2022-09-17 LAB — PROTIME-INR
INR: 1 (ref 0.8–1.2)
Prothrombin Time: 12.8 seconds (ref 11.4–15.2)

## 2022-09-17 LAB — ETHANOL: Alcohol, Ethyl (B): <10 mg/dL (ref <10)

## 2022-09-17 LAB — HCG, SERUM, QUALITATIVE: Preg, Serum: NEGATIVE

## 2022-09-17 LAB — CBG MONITORING, ED: Glucose-Capillary: 107 mg/dL — ABNORMAL HIGH (ref 70–99)

## 2022-09-17 LAB — POCT FASTING CBG KUC MANUAL ENTRY: POCT Glucose (KUC): 116 mg/dL — AB (ref 70–99)

## 2022-09-17 LAB — APTT: aPTT: 23 seconds — ABNORMAL LOW (ref 24–36)

## 2022-09-17 MED ORDER — GADOBUTROL 1 MMOL/ML IV SOLN
7.0000 mL | Freq: Once | INTRAVENOUS | Status: AC | PRN
  Administered 2022-09-17: 7 mL via INTRAVENOUS

## 2022-09-17 MED ORDER — LORAZEPAM 2 MG/ML IJ SOLN
1.0000 mg | Freq: Once | INTRAMUSCULAR | Status: AC
  Administered 2022-09-17: 1 mg via INTRAVENOUS
  Filled 2022-09-17: qty 1

## 2022-09-17 MED ORDER — IOHEXOL 350 MG/ML SOLN
75.0000 mL | Freq: Once | INTRAVENOUS | Status: AC | PRN
  Administered 2022-09-17: 75 mL via INTRAVENOUS

## 2022-09-17 NOTE — ED Provider Notes (Signed)
[place] EMERGENCY DEPARTMENT AT [facility] Provider Note   CSN: [id number] Arrival date & time: 09/17/22  1446  An emergency department physician performed an initial assessment on this suspected stroke patient at 1501.  History  Chief Complaint  Patient presents with   Stroke Symptoms    [name] is a 52 y.o. female.  Patient is a 52 year old female who presents as a code stroke.  She had presented to triage where code stroke was activated.  She said that she was at work and crossed her legs at which point she felt a sharp pain going from her right thigh all the way up her side into the right side of her face.  She said her vision got blurry in her right eye and she had numbness in the right side of her face with some burning around her right ear.  She had some numbness and weakness in her right arm and right leg.  She also looked in the mirror and noticed that her right face was drooping.  She is denying any chest pain.       Home Medications Prior to Admission medications   Medication Sig Start Date End Date Taking? Authorizing Provider  benzonatate (TESSALON) 100 MG capsule Take 1 capsule (100 mg total) by mouth every 8 (eight) hours. 01/12/22   Carlisle Beers, FNP  Guaifenesin 1200 MG TB12 Take 1 tablet (1,200 mg total) by mouth in the morning and at bedtime. 01/12/22   Carlisle Beers, FNP  loratadine (CLARITIN) 10 MG tablet Take 10 mg by mouth daily.    [provider]  LORazepam (ATIVAN) 0.5 MG tablet Take 1 tab 30 min before the appointment. 04/25/15   Esterwood, Amy S, PA-C  NON FORMULARY STRESS VITAMIN 1 TABS PO QD    [provider]  oxyCODONE-acetaminophen (PERCOCET/ROXICET) 5-325 MG tablet Take 2 tablets by mouth every 6 (six) hours as needed for moderate pain or severe pain. Patient not taking: Reported on 06/16/2015 04/24/15   Lavera Guise, MD      Allergies    Aspirin, Augmentin [amoxicillin-pot clavulanate],  Celexa [citalopram], Ciprofloxacin, Flagyl [metronidazole], and Other    Review of Systems   Review of Systems  Constitutional:  Negative for chills, diaphoresis, fatigue and fever.  HENT:  Negative for congestion, rhinorrhea and sneezing.   Eyes:  Positive for visual disturbance.  Respiratory:  Negative for cough, chest tightness and shortness of breath.   Cardiovascular:  Negative for chest pain and leg swelling.  Gastrointestinal:  Negative for abdominal pain, blood in stool, diarrhea, nausea and vomiting.  Genitourinary:  Negative for difficulty urinating, flank pain, frequency and hematuria.  Musculoskeletal:  Negative for arthralgias and back pain.  Skin:  Negative for rash.  Neurological:  Positive for facial asymmetry, numbness and headaches. Negative for dizziness, speech difficulty and weakness.    Physical Exam Updated Vital Signs BP (!) 151/98   Pulse 86   Temp 98.8 F (37.1 C) (Oral)   Resp 15   Ht 5\' 5"  (1.651 m)   Wt 74.8 kg   SpO2 99%   BMI 27.44 kg/m  Physical Exam Constitutional:      Appearance: She is well-developed.  HENT:     Head: Normocephalic and atraumatic.     Ears:     Comments: No redness to the ear.  No lesions noted.    Mouth/Throat:     Comments: No facial lesions noted. Eyes:     Pupils:  Pupils are equal, round, and reactive to light.  Cardiovascular:     Rate and Rhythm: Normal rate and regular rhythm.     Heart sounds: Normal heart sounds.  Pulmonary:     Effort: Pulmonary effort is normal. No respiratory distress.     Breath sounds: Normal breath sounds. No wheezing or rales.  Chest:     Chest wall: No tenderness.  Abdominal:     General: Bowel sounds are normal.     Palpations: Abdomen is soft.     Tenderness: There is no abdominal tenderness. There is no guarding or rebound.  Musculoskeletal:        General: Normal range of motion.     Cervical back: Normal range of motion and neck supple.  Lymphadenopathy:     Cervical: No  cervical adenopathy.  Skin:    General: Skin is warm and dry.     Findings: No rash.     Comments: Motor 5/5 all extremities Sensation grossly intact to LT all extremities Finger to Nose intact, no pronator drift CN II-XII grossly intact other than slight numbness to the right side of her face.    Neurological:     Mental Status: She is alert and oriented to person, place, and time.     ED Results / Procedures / Treatments   Labs (all labs ordered are listed, but only abnormal results are displayed) Labs Reviewed  APTT - Abnormal; Notable for the following components:      Result Value   aPTT 23 (*)    All other components within normal limits  COMPREHENSIVE METABOLIC PANEL - Abnormal; Notable for the following components:   Total Protein 6.3 (*)    All other components within normal limits  CBG MONITORING, ED - Abnormal; Notable for the following components:   Glucose-Capillary 107 (*)    All other components within normal limits  ETHANOL  PROTIME-INR  CBC  DIFFERENTIAL  HCG, SERUM, QUALITATIVE  RAPID URINE DRUG SCREEN, HOSP PERFORMED  URINALYSIS, ROUTINE W REFLEX MICROSCOPIC  I-STAT CHEM 8, ED    EKG EKG Interpretation Date/Time:  Friday September 17 2022 14:38:46 EDT Ventricular Rate:  104 PR Interval:  148 QRS Duration:  76 QT Interval:  328 QTC Calculation: 431 R Axis:   48  Text Interpretation: Sinus tachycardia Cannot rule out Anterior infarct , age undetermined Abnormal ECG When compared with ECG of 17-Feb-2014 05:33, PREVIOUS ECG IS PRESENT Confirmed by Rolan Bucco [phone]) on 09/17/2022 4:26:05 PM  Radiology CT ANGIO HEAD NECK W WO CM (CODE STROKE)  Result Date: 09/17/2022 CLINICAL DATA:  Neuro deficit, acute, stroke suspected EXAM: CT ANGIOGRAPHY HEAD AND NECK WITH AND WITHOUT CONTRAST TECHNIQUE: Multidetector CT imaging of the head and neck was performed using the standard protocol during bolus administration of intravenous contrast. Multiplanar CT image  reconstructions and MIPs were obtained to evaluate the vascular anatomy. Carotid stenosis measurements (when applicable) are obtained utilizing NASCET criteria, using the distal internal carotid diameter as the denominator. RADIATION DOSE REDUCTION: This exam was performed according to the departmental dose-optimization program which includes automated exposure control, adjustment of the mA and/or kV according to patient size and/or use of iterative reconstruction technique. CONTRAST:  75mL OMNIPAQUE IOHEXOL 350 MG/ML SOLN COMPARISON:  None Available. FINDINGS: CT HEAD FINDINGS See same day head CT for intracranial findings. CTA NECK FINDINGS Aortic arch: Standard branching. Imaged portion shows no evidence of aneurysm or dissection. No significant stenosis of the major arch vessel origins. Right carotid system:  No evidence of dissection, stenosis (50% or greater), or occlusion. Left carotid system: No evidence of dissection, stenosis (50% or greater), or occlusion. Vertebral arteries: Right-dominant. No evidence of dissection, stenosis (50% or greater), or occlusion. Skeleton: Negative. Other neck: Negative. Upper chest: Negative. Review of the MIP images confirms the above findings CTA HEAD FINDINGS Anterior circulation: No significant stenosis, proximal occlusion, aneurysm, or vascular malformation. Posterior circulation: No significant stenosis, proximal occlusion, aneurysm, or vascular malformation. Venous sinuses: As permitted by contrast timing, patent. Anatomic variants: None Review of the MIP images confirms the above findings IMPRESSION: 1. No intracranial large vessel occlusion or significant stenosis. 2. No hemodynamically significant stenosis in the neck. Electronically Signed   By: Lorenza Cambridge M.D.   On: 09/17/2022 15:37   CT HEAD CODE STROKE WO CONTRAST  Result Date: 09/17/2022 CLINICAL DATA:  Code stroke. Neuro deficit, acute, stroke suspected. Right facial numbness/droop. EXAM: CT HEAD WITHOUT  CONTRAST TECHNIQUE: Contiguous axial images were obtained from the base of the skull through the vertex without intravenous contrast. RADIATION DOSE REDUCTION: This exam was performed according to the departmental dose-optimization program which includes automated exposure control, adjustment of the mA and/or kV according to patient size and/or use of iterative reconstruction technique. COMPARISON:  None Available. FINDINGS: Brain: No acute intracranial hemorrhage. Gray-white differentiation is preserved. No hydrocephalus or extra-axial collection. No mass effect or midline shift. Vascular: No hyperdense vessel or unexpected calcification. Skull: No calvarial fracture or suspicious bone lesion. Skull base is unremarkable. Sinuses/Orbits: Complete opacification of the left frontal sinus. Partial opacification of the left sphenoid sinus. Orbits are unremarkable. Other: None. ASPECTS [facility] Stroke Program Early CT Score) - Ganglionic level infarction (caudate, lentiform nuclei, internal capsule, insula, M1-M3 cortex): 7 - Supraganglionic infarction (M4-M6 cortex): 3 Total score (0-10 with 10 being normal): 10 IMPRESSION: 1. No acute intracranial hemorrhage or evidence of acute large vessel territory infarct. ASPECT score is 10. 2. Paranasal sinus disease. Electronically Signed   By: Orvan Falconer M.D.   On: 09/17/2022 15:22    Procedures Procedures    Medications Ordered in ED Medications  iohexol (OMNIPAQUE) 350 MG/ML injection 75 mL (75 mLs Intravenous Contrast Given 09/17/22 1530)    ED Course/ Medical Decision Making/ A&P                             Medical Decision Making  Patient presents with sudden onset of numbness to the right side of her body with numbness to the right face and facial drooping.  The facial drooping has resolved.  She feels like the right side of her ear and face is hot.  I do not see any external lesions.  She had a CT of her head as well as a CTA.  No evidence of  aneurysm.  No hemorrhage.  No obvious stroke noted.  No large vessel occlusion noted on CTA.  Patient has been seen by Dr. Wilford Corner.  Labs reviewed and are nonconcerning.  MRI has been ordered.  If positive, patient will be admitted, if negative, may be able to be discharged.  Neurology to make that determination.  Pt care turned over to Dr. Anitra Lauth.  Final Clinical Impression(s) / ED Diagnoses Final diagnoses:  None    Rx / DC Orders ED Discharge Orders     None         Rolan Bucco, MD 09/17/22 [phone]

## 2022-09-17 NOTE — ED Notes (Signed)
Lactic acid tube drawn for mini lab. Needs to void, preparing to go to b/r. MRI sending for pt. EDP into see, at [facility].

## 2022-09-17 NOTE — ED Provider Triage Note (Signed)
Emergency Medicine Provider Triage Evaluation Note  [name] , a 52 y.o. female  was evaluated in triage.  Pt complains of right-sided facial droop and numbness along with pain and paresthesia of the right side.  Patient states she had a sudden onset of shooting pain coming up from the right leg all the way to the top of her head starting at 1:30 PM when she tried to cross her legs.  She states that she had numbness on the right side of her face and felt off balance.  She states the symptoms resolved after about 20 minutes but she continued to have the facial numbness.  She was driving back to work from lunch when she looked in the mirror and noticed that the right side of her face was drooping.  She immediately drove to the urgent care.  She was seen there but sent here for further evaluation.  Her facial droop has resolved..  Denies a history of migraine  Review of Systems  Positive: Numbness on the right side of face Negative: Headache  Physical Exam  BP (!) 182/109 (BP Location: Right Arm)   Pulse (!) 110   Temp 98.8 F (37.1 C) (Oral)   Resp 16   SpO2 98%  Gen:   Awake, no distress   Resp:  Normal effort  MSK:   Moves extremities without difficulty  Other:  No obvious facial droop  Medical Decision Making  Medically screening exam initiated at 3:01 PM.  Appropriate orders placed.  [name] was informed that the remainder of the evaluation will be completed by another provider, this initial triage assessment does not replace that evaluation, and the importance of remaining in the ED until their evaluation is complete.  Code stroke initiated.  I consulted Dr. Madelyn Brunner, [place], PA-C 09/17/22 1510

## 2022-09-17 NOTE — ED Notes (Signed)
Up to b/r, steady gait, med rec tech at [facility]

## 2022-09-17 NOTE — ED Notes (Signed)
Assumed care of patient here for r/o stroke. All pt dx studies complete and she is pending TOC and discharge home. Patient a/o x 4 respirations even and non labored.

## 2022-09-17 NOTE — ED Provider Notes (Signed)
[facility]    CSN: [id number] Arrival date & time: 09/17/22  1427      History   Chief Complaint Chief Complaint  Patient presents with   Facial Droop    HPI [name] is a 52 y.o. female.   Patient presents today with a 30-minute history of right-sided facial droop and associated right-sided numbness and pain.  She reports that she was at work when she crossed her legs and felt a sharp pain that went from her lower back throughout her trunk and into her face and ear.  Since then she has had ongoing pain involving her external ear with associated heat and redness as well as facial numbness particularly along her right mandible.  Immediately after this began she looked in the mirror and noticed that her smile was lopsided prompting evaluation.  She drove herself here today.  She denies any significant past medical history including diabetes, smoking, hypertension.  She does have hyperlipidemia but does not take medication for this.  Reports her father had a heart attack many years ago but denies any additional family history of cardiovascular disease.  She denies episodes of similar symptoms in the past.    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 Authorizing Provider   benzonatate (TESSALON) 100 MG capsule Take 1 capsule (100 mg total) by mouth every 8 (eight) hours. 01/12/22   Carlisle Beers, FNP  Guaifenesin 1200 MG TB12 Take 1 tablet (1,200 mg total) by mouth in the morning and at bedtime. 01/12/22   Carlisle Beers, FNP  loratadine (CLARITIN) 10 MG tablet Take 10 mg by mouth daily.    [provider]  LORazepam (ATIVAN) 0.5 MG tablet Take 1 tab 30 min before the appointment. 04/25/15   Esterwood, Amy S, PA-C  NON FORMULARY STRESS VITAMIN 1 TABS PO QD    [provider]  oxyCODONE-acetaminophen (PERCOCET/ROXICET) 5-325 MG tablet Take 2 tablets by mouth every 6 (six) hours as needed for moderate pain or severe pain. Patient not taking: Reported on 06/16/2015 04/24/15   Lavera Guise, MD    Family History Family History  Problem Relation Age of Onset   Diabetes Maternal Grandfather    Hypertension Maternal Grandfather    Heart disease Maternal Grandfather    Colon polyps Father     Social History Social History   Tobacco Use   Smoking status: Former  Smokeless tobacco: Never   Tobacco comments:    QUIT 15 YEARS AGO  Vaping Use   Vaping status: Never Used  Substance Use Topics   Alcohol use: Yes    Alcohol/week: 0.0 standard drinks of alcohol   Drug use: No     Allergies   Aspirin, Augmentin [amoxicillin-pot clavulanate], Celexa [citalopram], Ciprofloxacin, Flagyl [metronidazole], and Other   Review of Systems Review of Systems  Constitutional:  Positive for activity change. Negative for appetite change, fatigue and fever.  Eyes:  Negative for photophobia and visual disturbance.  Respiratory:  Negative for shortness of breath.   Cardiovascular:  Negative for chest pain, palpitations and leg swelling.  Gastrointestinal:  Negative for abdominal pain, diarrhea, nausea and vomiting.  Neurological:  Positive for facial asymmetry. Negative for dizziness, syncope, speech difficulty, weakness, light-headedness,  numbness and headaches.     Physical Exam Triage Vital Signs ED Triage Vitals [09/17/22 1430]  Encounter Vitals Group     BP (!) 194/122     Systolic BP Percentile      Diastolic BP Percentile      Pulse Rate (!) 140     Resp (!) 23     Temp 98.9 F (37.2 C)     Temp Source Oral     SpO2 100 %     Weight      Height      Head Circumference      Peak Flow      Pain Score      Pain Loc      Pain Education      Exclude from Growth Chart    No data found.  Updated Vital Signs BP (!) 194/122 (BP Location: Right Arm)   Pulse (!) 140   Temp 98.9 F (37.2 C) (Oral)   Resp (!) 23   SpO2 100%   Visual Acuity Right Eye Distance:   Left Eye Distance:   Bilateral Distance:    Right Eye Near:   Left Eye Near:    Bilateral Near:     Physical Exam Vitals reviewed.  Constitutional:      General: She is awake. She is not in acute distress.    Appearance: Normal appearance. She is well-developed. She is ill-appearing.     Comments: Very pleasant female appears stated age tearful but in no acute distress  HENT:     Head: Normocephalic and atraumatic. No raccoon eyes, Battle's sign or contusion.     Right Ear: Tympanic membrane, ear canal and external ear normal. No hemotympanum.     Left Ear: Tympanic membrane, ear canal and external ear normal. No hemotympanum.     Nose: Nose normal.     Mouth/Throat:     Tongue: Tongue does not deviate from midline.     Pharynx: Uvula midline. No oropharyngeal exudate or posterior oropharyngeal erythema.     Comments: Symmetrical smile Eyes:     Extraocular Movements: Extraocular movements intact.     Conjunctiva/sclera: Conjunctivae normal.     Pupils: Pupils are equal, round, and reactive to light.  Cardiovascular:     Rate and Rhythm: Regular rhythm. Tachycardia present.     Heart sounds: Normal heart sounds, S1 normal and S2 normal. No murmur heard. Pulmonary:     Effort: Pulmonary effort is normal.     Breath sounds: Normal  breath sounds. No wheezing, rhonchi or rales.     Comments: Clear to auscultation bilaterally Musculoskeletal:     Comments: Strength 5/5 bilateral  upper and lower extremities  Neurological:     General: No focal deficit present.     Mental Status: She is alert and oriented to person, place, and time.     Cranial Nerves: Cranial nerves 2-12 are intact. No cranial nerve deficit or facial asymmetry.     Motor: Motor function is intact.     Coordination: Coordination is intact.     Gait: Gait is intact.     Comments: Cranial nerves II through XII grossly intact.  No focal neurological defect on exam.  Psychiatric:        Behavior: Behavior is cooperative.      UC Treatments / Results  Labs (all labs ordered are listed, but only abnormal results are displayed) Labs Reviewed  POCT FASTING CBG [facility] MANUAL ENTRY - Abnormal; Notable for the following components:      Result Value   POCT Glucose ([facility]) 116 (*)    All other components within normal limits    EKG   Radiology No results found.  Procedures Procedures (including critical care time)  Medications Ordered in UC Medications - No data to display  Initial Impression / Assessment and Plan / UC Course  I have reviewed the triage vital signs and the nursing notes.  Pertinent labs & imaging results that were available during my care of the patient were reviewed by me and considered in my medical decision making (see chart for details).     Patient was noted to be hypertensive, tachycardic, tachypneic.  Her physical exam is reassuring with no focal neurological defect or obvious asymmetry.  Code stroke was not called given normal exam.  Blood sugar was appropriate at 116.  I suspect that her abnormal vital signs are related to situational stress.  We did discuss that given her new onset facial numbness with recent facial asymmetry she should go to the emergency room for further evaluation and management.  Requested patient go  back [facility] but she declined this as she did not want to have to pay for it and felt that she could take herself since the emergency room is across the street.  We again discussed that my recommendation is to go to the emergency room by [facility] but she declined this.  She was stable to time of discharge.  Final Clinical Impressions(s) / UC Diagnoses   Final diagnoses:  Facial numbness  Tachycardia  Hypertensive emergency   Discharge Instructions   None    ED Prescriptions   None    PDMP not reviewed this encounter.   Jeani Hawking, PA-C 09/17/22 1444

## 2022-09-17 NOTE — ED Triage Notes (Signed)
Pt was at work and crossed her legs and got a sharp pain from right leg up to right side of head and vision went blurred immediately. Pt states after 2 mins vision came back. Pt states got nauseated. Pt states got up to walk to next room and didn't feel steady and numbness in right jaw and right side of neck. Pt states looked in mirror and pupils were tiny. Pt states when she looked in the [facility] and smiled and she had facial droop. LKW 1330 today

## 2022-09-17 NOTE — Consult Note (Addendum)
Neurology Consultation  Reason for Consult: Strokelike symptoms Referring Physician: Dr Fredderick Phenix  CC: Right-sided numbness, right facial weakness  History is obtained from: Patient  HPI: [name] is a 52 y.o. female past medical history of diverticulitis, no other cerebrovascular risk factors, presented to the emergency room for evaluation of sudden onset of strokelike symptoms. Reported sitting at work and tried to cross her legs when she felt a sudden onset of numbness going from her right thigh all the way up to her right face.  Did not make much of it.  Started driving.  Looked at herself in the rearview mirror of the car and the right side of the face was drooping.  Droop has since resolved.  Numbness in the leg and arm have resolved but feels numb on the cheek and over the jaw on the right side.  Also reported feeling dizzy and lightheaded at the time this was happening and still does not feel 100% normal.  Also complains of a mild frontal headache after arrival in the ER. No prior history of migraines. No prior history of stroke or heart attacks.   Father has myasthenia gravis.  An uncle has aneurysm.   LKW: 1:30 PM IV thrombolysis given?: no, too mild to treat EVT: No ELVO Premorbid modified Rankin scale (mRS): 0  ROS: Full ROS was performed and is negative except as noted in the HPI.   Past Medical History:  Diagnosis Date   Diverticulitis    2017   Liver hemangioma    2017   SAB (spontaneous abortion) 02/22/2005   Type B blood, Rh positive     Family History  Problem Relation Age of Onset   Diabetes Maternal Grandfather    Hypertension Maternal Grandfather    Heart disease Maternal Grandfather    Colon polyps Father      Social History:   reports that she has quit smoking. Her smoking use included cigarettes. She has never used smokeless tobacco. She reports current alcohol use of about 7.0 standard drinks of alcohol per week. She reports that she does not  use drugs.  Medications No current facility-administered medications for this encounter.  Current Outpatient Medications:    benzonatate (TESSALON) 100 MG capsule, Take 1 capsule (100 mg total) by mouth every 8 (eight) hours., Disp: 21 capsule, Rfl: 0   Guaifenesin 1200 MG TB12, Take 1 tablet (1,200 mg total) by mouth in the morning and at bedtime., Disp: 14 tablet, Rfl: 0   loratadine (CLARITIN) 10 MG tablet, Take 10 mg by mouth daily., Disp: , Rfl:    LORazepam (ATIVAN) 0.5 MG tablet, Take 1 tab 30 min before the appointment., Disp: 2 tablet, Rfl: 0   NON FORMULARY, STRESS VITAMIN 1 TABS PO QD, Disp: , Rfl:    oxyCODONE-acetaminophen (PERCOCET/ROXICET) 5-325 MG tablet, Take 2 tablets by mouth every 6 (six) hours as needed for moderate pain or severe pain. (Patient not taking: Reported on 06/16/2015), Disp: 10 tablet, Rfl: 0   Exam: Current vital signs: BP (!) 151/98   Pulse 86   Temp 98.8 F (37.1 C) (Oral)   Resp 15   Ht 5\' 5"  (1.651 m)   Wt 74.8 kg   SpO2 99%   BMI 27.44 kg/m  Vital signs in last 24 hours: Temp:  [98.8 F (37.1 C)-98.9 F (37.2 C)] 98.8 F (37.1 C) (07/26 1458) Pulse Rate:  [86-140] 86 (07/26 1600) Resp:  [14-26] 15 (07/26 1600) BP: (148-194)/(98-122) 151/98 (07/26 1600) SpO2:  [97 %-  100 %] 99 % (07/26 1600) Weight:  [74.8 kg] 74.8 kg (07/26 1501)  GENERAL: Awake, alert in NAD HEENT: - Normocephalic and atraumatic, dry mm, no LN++, no Thyromegally LUNGS - Clear to auscultation bilaterally with no wheezes CV - S1S2 RRR, no m/r/g, equal pulses bilaterally. ABDOMEN - Soft, nontender, nondistended with normoactive BS Ext: warm, well perfused, intact peripheral pulses, no edema edema  NEURO:  Mental Status: AA&Ox3  Language: speech is nondysarthric.  Naming, repetition, fluency, and comprehension intact. Cranial Nerves: PERRL EOMI, visual fields full, no facial asymmetry, slightly diminished facial sensation over the right lower cheek and jaw, hearing  intact, tongue/uvula/soft palate midline, normal sternocleidomastoid and trapezius muscle strength. No evidence of tongue atrophy or fibrillations Motor: 5/5 in all fours Tone: is normal and bulk is normal Sensation- Intact to light touch bilaterally Coordination: FTN intact bilaterally, no ataxia in BLE.Gait- deferred  NIHSS-1   Labs I have reviewed labs in epic and the results pertinent to this consultation are:  CBC    Component Value Date/Time   WBC 5.7 09/17/2022 1506   RBC 4.50 09/17/2022 1506   HGB 13.8 09/17/2022 1506   HCT 41.4 09/17/2022 1506   PLT 284 09/17/2022 1506   MCV 92.0 09/17/2022 1506   MCH 30.7 09/17/2022 1506   MCHC 33.3 09/17/2022 1506   RDW 12.2 09/17/2022 1506   LYMPHSABS 1.6 09/17/2022 1506   MONOABS 0.5 09/17/2022 1506   EOSABS 0.2 09/17/2022 1506   BASOSABS 0.1 09/17/2022 1506    CMP     Component Value Date/Time   NA 140 09/17/2022 1506   K 4.0 09/17/2022 1506   CL 105 09/17/2022 1506   CO2 25 09/17/2022 1506   GLUCOSE 95 09/17/2022 1506   BUN 11 09/17/2022 1506   CREATININE 0.71 09/17/2022 1506   CALCIUM 9.2 09/17/2022 1506   PROT 6.3 (L) 09/17/2022 1506   ALBUMIN 3.9 09/17/2022 1506   AST 17 09/17/2022 1506   ALT 17 09/17/2022 1506   ALKPHOS 61 09/17/2022 1506   BILITOT 0.5 09/17/2022 1506   GFRNONAA >60 09/17/2022 1506   GFRAA >60 04/24/2015 0915    Lipid Panel     Component Value Date/Time   CHOL 208 (H) 05/10/2014 0941   TRIG 86 05/10/2014 0941   HDL 65 05/10/2014 0941   CHOLHDL 3.2 05/10/2014 0941   VLDL 17 05/10/2014 0941   LDLCALC 126 (H) 05/10/2014 0941   Imaging I have reviewed the images obtained:  Code stroke CT head and CT angiography head and neck: Unremarkable for acute process or emergent LVO.  Assessment:  52 year old with no significant cerebrovascular risk factors, presenting for evaluation of sudden onset of right-sided numbness followed by transient episode of right facial droop that has since  resolved but the numbness on the face has persisted.  Given the symptoms are very focal and localized to the left hemisphere, I would recommend obtaining a stat MRI to ensure that this is not a stroke. After arrival to the ER she also reports a mild frontal headache-not a migraine at baseline.  Differential should also include demyelinating disease  Impression: Broad differentials-complex migraine versus stroke/TIA versus demyelinating disease.  Recommendations: Will recommend stat MRI brain-will recommend with or without contrast to r/o demyelination v stroke v any other structural abnormality  Will likely need admission if MRI shows concerning findings.  If the MRI is negative, may be able to follow-up outpatient for TIA workup versus complex migraine workup and follow-up with outpatient neurology in  8 to 12 weeks.  Check UA chest x-ray and tox screen  Plan d/w Dr Fredderick Phenix -- Milon Dikes, MD Neurologist [facility] Pager: [phone]   ADDENDUM MRI negative for acute process. Likely complex migraine versus nonspecific paresthesias versus low suspicion for TIA.  Even if it is a TIA, ABCD 2 score is low.  Can get outpatient workup. No further inpatient workup Plan discussed with Dr. Anitra Lauth  -- Milon Dikes, MD Neurologist [facility] Pager: [phone]

## 2022-09-17 NOTE — ED Triage Notes (Signed)
Patient here today with c/o facial numbness on the right side of face and facial droop today. Patient is worried. Pain shooting up from chest to right side of face. The right side of her face feels warm.

## 2022-09-17 NOTE — Discharge Instructions (Signed)
MRI and blood work today look normal.  It is difficult to tell whether this was a complicated migraine, possibly a mini stroke or a nerve issue.  You will need to follow-up with her regular doctor to have the additional testing for TIA done.  If you have recurrence of your symptoms you should return to the emergency room.

## 2022-09-17 NOTE — ED Notes (Signed)
Patient is being discharged from the Urgent Care and sent to the Emergency Department via pov (pt refused carelink) . Per Dorann Ou PA, patient is in need of higher level of care due to stroke like symptoms. Patient is aware and verbalizes understanding of plan of care.  Vitals:   09/17/22 1430  BP: (!) 194/122  Pulse: (!) 140  Resp: (!) 23  Temp: 98.9 F (37.2 C)  SpO2: 100%

## 2022-09-17 NOTE — ED Notes (Signed)
Taken to MRI, no changes, alert, NAD, calm, interactive.

## 2022-09-17 NOTE — ED Notes (Signed)
Pt in MRI scanner  

## 2022-09-17 NOTE — Code Documentation (Signed)
Stroke Response Nurse Documentation Code Documentation  [name] is a 52 y.o. female arriving to [facility]  via [employer] on 09/17/2022 with no past medical hx. Code stroke was activated by ED.   Patient from work where she was LKW at 1330 and now complaining of sudden onset of dizziness, blurred vision, and numbness that shot up the right side of her body. Blurred vision subsided and then numbness continued.  Stroke team at the bedside on patient arrival. Labs drawn and patient cleared for CT by PA Harris. Patient to CT with team. NIHSS 1, see documentation for details and code stroke times. Patient with right decreased sensation on exam. The following imaging was completed:  CT Head and CTA. Patient is not a candidate for IV Thrombolytic due to too mild to treat. Patient is not a candidate for IR due to no LVO on imaging.   Care Plan: q30 NIHSS/VS until 1800 when patient is outside TNK window, then q2 NIHSS/VS.   Bedside handoff with ED RN Linus Galas, Ramon Dredge  Stroke Response RN

## 2022-09-17 NOTE — ED Provider Notes (Signed)
Patient's MRI is negative for acute findings.  Spoke with neurology Dr. Wilford Corner who recommends outpatient follow-up for TIA workup but low ABCD score would not support admission.  Also could be a complex migraine or nonspecific paresthesias.  Findings discussed with the patient.  She is relieved everything is normal.  She does not have a PCP but does report she has insurance.  Consulted transition of care team to help with PCP appointment.   Gwyneth Sprout, MD 09/17/22 [phone]

## 2022-09-17 NOTE — ED Notes (Signed)
Pt brought back from MRI, reports "feeling better", "sx remain, but improved".

## 2022-09-17 NOTE — ED Notes (Addendum)
Neuro assessment unchanged, in MRI. Called over for claustrophobia, meds given. States, numbness improved, but not gone.

## 2022-09-22 ENCOUNTER — Ambulatory Visit: Payer: BC Managed Care – PPO

## 2022-09-22 ENCOUNTER — Encounter: Payer: Self-pay | Admitting: Internal Medicine

## 2022-09-22 ENCOUNTER — Ambulatory Visit: Payer: BC Managed Care – PPO | Attending: Internal Medicine | Admitting: Internal Medicine

## 2022-09-22 VITALS — BP 140/84 | HR 72 | Ht 66.0 in | Wt 182.0 lb

## 2022-09-22 DIAGNOSIS — R002 Palpitations: Secondary | ICD-10-CM

## 2022-09-22 DIAGNOSIS — R55 Syncope and collapse: Secondary | ICD-10-CM

## 2022-09-22 DIAGNOSIS — I10 Essential (primary) hypertension: Secondary | ICD-10-CM

## 2022-09-22 DIAGNOSIS — R5383 Other fatigue: Secondary | ICD-10-CM | POA: Diagnosis not present

## 2022-09-22 MED ORDER — HYDROCHLOROTHIAZIDE 25 MG PO TABS: 25.0000 mg | ORAL_TABLET | Freq: Every day | ORAL | 3 refills | Status: DC

## 2022-09-22 NOTE — Progress Notes (Unsigned)
Enrolled for [employer] to mail a ZIO XT long term holter monitor to the patients address on file.  

## 2022-09-22 NOTE — Patient Instructions (Signed)
Medication Instructions:  Your physician has recommended you make the following change in your medication:  START: hydrochlorothiazide (H C TZ) 25 mg by mouth once daily  *If you need a refill on your cardiac medications before your next appointment, please call your pharmacy*   Lab Work: IN 2 WEEKS: BMP, TSH  If you have labs (blood work) drawn today and your tests are completely normal, you will receive your results only by: MyChart Message (if you have MyChart) OR A paper copy in the mail If you have any lab test that is abnormal or we need to change your treatment, we will call you to review the results.   Testing/Procedures: Your physician has requested that you have an echocardiogram. Echocardiography is a painless test that uses sound waves to create images of your heart. It provides your doctor with information about the size and shape of your heart and how well your heart's chambers and valves are working. This procedure takes approximately one hour. There are no restrictions for this procedure. Please do NOT wear cologne, perfume, aftershave, or lotions (deodorant is allowed). Please arrive 15 minutes prior to your appointment time.  Your physician has requested that you wear a heart monitor.   Follow-Up: At [facility], you and your health needs are our priority.  As part of our continuing mission to provide you with exceptional heart care, we have created designated Provider Care Teams.  These Care Teams include your primary Cardiologist (physician) and Advanced Practice Providers (APPs -  Physician Assistants and Nurse Practitioners) who all work together to provide you with the care you need, when you need it.    Your next appointment:   6-7 month(s)  Provider:   Christell Constant, MD     Other Instructions [name]- Long Term Monitor Instructions  Your physician has requested you wear a ZIO patch monitor for 14 days.  This is a single patch monitor.  [employer] supplies one patch monitor per enrollment. Additional stickers are not available. Please do not apply patch if you will be having a Nuclear Stress Test,   Cardiac CT, MRI, or Chest Xray during the period you would be wearing the  monitor. The patch cannot be worn during these tests. You cannot remove and re-apply the  ZIO XT patch monitor.  Your ZIO patch monitor will be mailed 3 day USPS to your address on file. It may take 3-5 days  to receive your monitor after you have been enrolled.  Once you have received your monitor, please review the enclosed instructions. Your monitor  has already been registered assigning a specific monitor serial # to you.  Billing and Patient Assistance Program Information  We have supplied [employer] with any of your insurance information on file for billing purposes. [employer] offers a sliding scale Patient Assistance Program for patients that do not have  insurance, or whose insurance does not completely cover the cost of the ZIO monitor.  You must apply for the Patient Assistance Program to qualify for this discounted rate.  To apply, please call [employer] at [phone], select option 4, select option 2, ask to apply for  Patient Assistance Program. [name] will ask your household income, and how many people  are in your household. They will quote your out-of-pocket cost based on that information.  [employer] will also be able to set up a 45-month, interest-free payment plan if needed.  Applying the monitor   Shave hair from upper left chest.  Hold abrader  disc by orange tab. Rub abrader in 40 strokes over the upper left chest as  indicated in your monitor instructions.  Clean area with 4 enclosed alcohol pads. Let dry.  Apply patch as indicated in monitor instructions. Patch will be placed under collarbone on left  side of chest with arrow pointing upward.  Rub patch adhesive wings for 2 minutes. Remove white label marked "1". Remove the white  label  marked "2". Rub patch adhesive wings for 2 additional minutes.  While looking in a mirror, press and release button in center of patch. A small green light will  flash 3-4 times. This will be your only indicator that the monitor has been turned on.  Do not shower for the first 24 hours. You may shower after the first 24 hours.  Press the button if you feel a symptom. You will hear a small click. Record Date, Time and  Symptom in the Patient Logbook.  When you are ready to remove the patch, follow instructions on the last 2 pages of Patient  Logbook. Stick patch monitor onto the last page of Patient Logbook.  Place Patient Logbook in the blue and white box. Use locking tab on box and tape box closed  securely. The blue and white box has prepaid postage on it. Please place it in the mailbox as  soon as possible. Your physician should have your test results approximately 7 days after the  monitor has been mailed back to [facility].  Call [facility] Customer Care at [phone] if you have questions regarding  your ZIO XT patch monitor. Call them immediately if you see an orange light blinking on your  monitor.  If your monitor falls off in less than 4 days, contact our Monitor department at [phone].  If your monitor becomes loose or falls off after 4 days call [employer] at [phone] for  suggestions on securing your monitor

## 2022-09-22 NOTE — Progress Notes (Signed)
  Cardiology Office Note:  .    Date:  09/22/2022  ID:  [name], DOB 14-Apr-1970, MRN [id number] PCP: Pcp, No  [facility] Providers Cardiologist:  Christell Constant, MD     CC: Stroke like symptoms Consulted for the evaluation of palpitations at the behest of Dr. Anitra Lauth  History of Present Illness: .    [name] is a 52 y.o. female with hx of diverticulosis, with sinus tachycardia on EKG and HTN.  Has recent ED visit. Neurology Dr. Wilford Corner  recommended outpatient follow-up for TIA   Patient notes that she is feeling sudden onset facial numbness and with pinpoint pupils and facial droop.  No evidence of stroke.   Has had no chest pain, chest pressure, chest tightness, chest stinging except after IV contrast for the CTPE.  She notes a fair amount of anxiety and panic.  She is changing jobs for the third time in several years.   Her ex-husband is about to be remarried.   No shortness of breath, DOE.  No PND or orthopnea.  Notes weight gain, leg swelling , or abdominal swelling.  No change in BP either way.   Notes palpitations or funny heart beats that occur sporadically not related to stress.  Notes episodes of near syncope, where she feels her heart in her ears.  Has near syncope.   Patient reports prior cardiac testing including- father has AF and MG and had an atrial fibrillation.  Relevant histories: .  Social; Has a pool, originally from [place] ROS: As per HPI.  Physical Exam:    VS:  BP (!) 140/84   Pulse 72   Ht 5\' 6"  (1.676 m)   Wt 182 lb (82.6 kg)   SpO2 98%   BMI 29.38 kg/m    Wt Readings from Last 3 Encounters:  09/22/22 182 lb (82.6 kg)  09/17/22 164 lb 14.5 oz (74.8 kg)  05/01/15 165 lb (74.8 kg)    Gen: no distress  Neck: No JVD Cardiac: No Rubs or Gallops, No murmur, RRR +2 radial pulses Respiratory: Clear to auscultation bilaterally, normal effort, normal  respiratory rate GI: Soft, nontender, non-distended  MS:  Non-pitting  edema;  moves all extremities Integument: Skin feels warm Neuro:  At time of evaluation, alert and oriented to person/place/time/situation  Psych: Normal affect, patient feels well   ASSESSMENT AND PLAN: .    HTN - I suspect this is the largely portion of her symptoms - discussed dietary and exercise intervention - given symptoms will give hydrochlorothiazide 25 mg PO daily and BMP in 1-2 weeks - her stress is a component of her disease - as testing comes in we will asked about BP and may need to bring her in sooner  Query of TIA Palpitations - lipids at next blood draw - will get 2 week non live ziopatch - I suspect her sx may be related to stress  Fatigue - TSH  Winter f/u with me  Riley Lam, MD FASE [facility] Cardiologist [facility]  [address] [place], [address] [place], [place] [address] [phone]  3:08 PM

## 2022-09-28 ENCOUNTER — Ambulatory Visit (INDEPENDENT_AMBULATORY_CARE_PROVIDER_SITE_OTHER): Payer: BC Managed Care – PPO

## 2022-09-28 DIAGNOSIS — R5383 Other fatigue: Secondary | ICD-10-CM | POA: Diagnosis not present

## 2022-09-28 DIAGNOSIS — R002 Palpitations: Secondary | ICD-10-CM

## 2022-09-28 DIAGNOSIS — I10 Essential (primary) hypertension: Secondary | ICD-10-CM

## 2022-09-28 DIAGNOSIS — R55 Syncope and collapse: Secondary | ICD-10-CM

## 2022-09-28 LAB — ECHOCARDIOGRAM COMPLETE
Area-P 1/2: 5.13 cm2
Calc EF: 42.2 %
S' Lateral: 1.92 cm
Single Plane A2C EF: 33.8 %
Single Plane A4C EF: 49.7 %

## 2022-10-06 ENCOUNTER — Ambulatory Visit: Payer: BC Managed Care – PPO | Attending: Internal Medicine

## 2022-10-06 DIAGNOSIS — R5383 Other fatigue: Secondary | ICD-10-CM

## 2022-10-06 DIAGNOSIS — R002 Palpitations: Secondary | ICD-10-CM

## 2022-10-06 DIAGNOSIS — I10 Essential (primary) hypertension: Secondary | ICD-10-CM

## 2022-10-06 DIAGNOSIS — R55 Syncope and collapse: Secondary | ICD-10-CM

## 2022-10-06 LAB — BASIC METABOLIC PANEL
BUN/Creatinine Ratio: 13 (ref 9–23)
BUN: 10 mg/dL (ref 6–24)
CO2: 26 mmol/L (ref 20–29)
Calcium: 9.9 mg/dL (ref 8.7–10.2)
Chloride: 98 mmol/L (ref 96–106)
Creatinine, Ser: 0.76 mg/dL (ref 0.57–1.00)
Glucose: 103 mg/dL — ABNORMAL HIGH (ref 70–99)
Potassium: 3.7 mmol/L (ref 3.5–5.2)
Sodium: 139 mmol/L (ref 134–144)
eGFR: 94 mL/min/{1.73_m2} (ref >59)

## 2022-10-06 LAB — TSH: TSH: 1.43 u[IU]/mL (ref 0.450–4.500)

## 2023-09-12 ENCOUNTER — Other Ambulatory Visit: Payer: Self-pay | Admitting: Internal Medicine

## 2023-09-12 ENCOUNTER — Telehealth: Payer: Self-pay | Admitting: Internal Medicine

## 2023-09-12 MED ORDER — HYDROCHLOROTHIAZIDE 25 MG PO TABS: 25.0000 mg | ORAL_TABLET | Freq: Every day | ORAL | 0 refills | Status: DC

## 2023-09-12 NOTE — Telephone Encounter (Signed)
 *  STAT* If patient is at the pharmacy, call can be transferred to refill team.   1. Which medications need to be refilled? (please list name of each medication and dose if known)   hydrochlorothiazide  (HYDRODIURIL ) 25 MG tablet   2. Which pharmacy/location (including street and city if local pharmacy) is medication to be sent to?  [facility], [address] Phone [phone]  3. Do they need a 30 day or 90 day supply? Patient needs a 5 day refill sent to [facility] because she left her medications at home. This is separate from her other refill request

## 2023-09-12 NOTE — Telephone Encounter (Signed)
 RX sent to requested Pharmacy

## 2023-09-12 NOTE — Telephone Encounter (Signed)
*  STAT* If patient is at the pharmacy, call can be transferred to refill team.   1. Which medications need to be refilled? (please list name of each medication and dose if known) hydrochlorothiazide  (HYDRODIURIL ) 25 MG tablet   2. Which pharmacy/location (including street and city if local pharmacy) is medication to be sent to? [id number] - [place], [place] - [address]    3. Do they need a 30 day or 90 day supply? 30

## 2023-10-05 ENCOUNTER — Other Ambulatory Visit: Payer: Self-pay | Admitting: Internal Medicine

## 2023-11-21 ENCOUNTER — Ambulatory Visit (INDEPENDENT_AMBULATORY_CARE_PROVIDER_SITE_OTHER): Payer: Self-pay | Admitting: Family Medicine

## 2023-11-21 ENCOUNTER — Encounter: Payer: Self-pay | Admitting: Family Medicine

## 2023-11-21 VITALS — BP 137/89 | HR 80 | Temp 98.7°F | Ht 65.75 in | Wt 180.8 lb

## 2023-11-21 DIAGNOSIS — I10 Essential (primary) hypertension: Secondary | ICD-10-CM

## 2023-11-21 NOTE — Progress Notes (Signed)
 Office Note 11/21/2023  CC:  Chief Complaint  Patient presents with   Annual Exam    New Patient Establish Care     HPI:  [name] is a 53 y.o. female who is here to establish care, follow-up hypertension. Patient's most recent primary MD: none Old records in epic/health Link EMR were reviewed prior to or during today's visit.  Wants to establish care.  She is between jobs, has no insurance. Home blood pressures consistently less than 130/80. She takes HCTZ  Past Medical History:  Diagnosis Date   Diverticulosis    With diverticulitis 2017   Essential hypertension    Liver hemangioma    2017   Right wrist fracture     Past Surgical History:  Procedure Laterality Date   CESAREAN SECTION  02/23/2007   COLONOSCOPY     05/2015: Diverticulosis.  Recall 10 years.   HERNIA REPAIR  02/23/2004   LASER ABLATION OF THE CERVIX  02/23/2003   cin I   TRANSTHORACIC ECHOCARDIOGRAM     09/2022: Normal    Family History  Problem Relation Age of Onset   Diabetes Maternal Grandfather    Hypertension Maternal Grandfather    Heart disease Maternal Grandfather    Colon polyps Father     Social History   Socioeconomic History   Marital status: Divorced    Spouse name: Not on file   Number of children: Not on file   Years of education: Not on file   Highest education level: Bachelor's degree (e.g., BA, AB, BS)  Occupational History   Not on file  Tobacco Use   Smoking status: Former    Types: Cigarettes   Smokeless tobacco: Never   Tobacco comments:    QUIT 15 YEARS AGO  Vaping Use   Vaping status: Never Used  Substance and Sexual Activity   Alcohol use: Yes    Alcohol/week: 7.0 standard drinks of alcohol    Types: 7 Glasses of wine per week   Drug use: No   Sexual activity: Yes  Other Topics Concern   Not on file  Social History Narrative   Not on file   Social Drivers of Health   Financial Resource Strain: Low Risk  (11/14/2023)   Overall  Financial Resource Strain (CARDIA)    Difficulty of Paying Living Expenses: Not very hard  Food Insecurity: No Food Insecurity (11/14/2023)   Hunger Vital Sign    Worried About Running Out of Food in the Last Year: Never true    Ran Out of Food in the Last Year: Never true  Transportation Needs: No Transportation Needs (11/14/2023)   PRAPARE - Administrator, Civil Service (Medical): No    Lack of Transportation (Non-Medical): No  Physical Activity: Insufficiently Active (11/14/2023)   Exercise Vital Sign    Days of Exercise per Week: 1 day    Minutes of Exercise per Session: 20 min  Stress: Stress Concern Present (11/14/2023)   [employer] of Occupational Health - Occupational Stress Questionnaire    Feeling of Stress: Very much  Social Connections: Socially Isolated (11/14/2023)   Social Connection and Isolation Panel    Frequency of Communication with Friends and Family: Once a week    Frequency of Social Gatherings with Friends and Family: Once a week    Attends Religious Services: Never    Database administrator or Organizations: No    Attends Engineer, structural: Not on file    Marital Status:  Divorced  Intimate Partner Violence: Unknown (05/29/2021)   Received from [facility]   HITS    Physically Hurt: Not on file    Insult or Talk Down To: Not on file    Threaten Physical Harm: Not on file    Scream or Curse: Not on file    Outpatient Encounter Medications as of 11/21/2023  Medication Sig   cholecalciferol (VITAMIN D3) 25 MCG (1000 UNIT) tablet Take 1,000 Units by mouth daily.   hydrochlorothiazide  (HYDRODIURIL ) 25 MG tablet Take 1 tablet (25 mg total) by mouth daily.   loratadine (CLARITIN) 10 MG tablet Take 10 mg by mouth daily as needed for allergies.   Multiple Vitamin (MULTIVITAMIN WITH MINERALS) TABS tablet Take 1 tablet by mouth daily.   sodium chloride  (OCEAN) 0.65 % SOLN nasal spray Place 1 spray into both nostrils every evening.    Cholecalciferol (VITAMIN D) 50 MCG (2000 UT) tablet Take 2,000 Units by mouth daily.   No facility-administered encounter medications on file as of 11/21/2023.    Allergies  Allergen Reactions   Buspirone Anaphylaxis    Facial numbness   Aspirin    Augmentin [Amoxicillin-Pot Clavulanate] Other (See Comments)    PT REQUEST NOT TO TAKE THIS MED   Band-Aid Infection Defense [Bacitracin-Polymyxin B] Other (See Comments)    rash   Celexa  [Citalopram ]    Ciprofloxacin  Hives   Covid-19 Mrna Vaccine (Pfizer) [Covid-19 Mrna Vacc (Moderna)] Other (See Comments)    Facial numbness   Flagyl  [Metronidazole ] Hives   Other     HAYFEVER    Review of Systems  Constitutional:  Negative for appetite change, chills, fatigue and fever.  HENT:  Negative for congestion, dental problem, ear pain and sore throat.   Eyes:  Negative for discharge, redness and visual disturbance.  Respiratory:  Negative for cough, chest tightness, shortness of breath and wheezing.   Cardiovascular:  Negative for chest pain, palpitations and leg swelling.  Gastrointestinal:  Negative for abdominal pain, blood in stool, diarrhea, nausea and vomiting.  Genitourinary:  Negative for difficulty urinating, dysuria, flank pain, frequency, hematuria and urgency.  Musculoskeletal:  Negative for arthralgias, back pain, joint swelling, myalgias and neck stiffness.  Skin:  Negative for pallor and rash.  Neurological:  Negative for dizziness, speech difficulty, weakness and headaches.  Hematological:  Negative for adenopathy. Does not bruise/bleed easily.  Psychiatric/Behavioral:  Negative for confusion and sleep disturbance. The patient is not nervous/anxious.     PE; Blood pressure 137/89, pulse 80, temperature 98.7 F (37.1 C), height 5' 5.75 (1.67 m), weight 180 lb 12.8 oz (82 kg), SpO2 97%. Body mass index is 29.4 kg/m.  Physical Exam  Gen: Alert, well appearing.  Patient is oriented to person, place, time, and  situation. AFFECT: pleasant, lucid thought and speech. ENT: Ears: EACs clear, normal epithelium.  TMs with good light reflex and landmarks bilaterally.  Eyes: no injection, icteris, swelling, or exudate.  EOMI, PERRLA. Nose: no drainage or turbinate edema/swelling.  No injection or focal lesion.  Mouth: lips without lesion/swelling.  Oral mucosa pink and moist.  Dentition intact and without obvious caries or gingival swelling.  Oropharynx without erythema, exudate, or swelling.  Neck: supple/nontender.  No LAD, mass, or TM.  Carotid pulses 2+ bilaterally, without bruits. CV: RRR, no m/r/g.   LUNGS: CTA bilat, nonlabored resps, good aeration in all lung fields. EXT: no clubbing, cyanosis, or edema.    Pertinent labs:  Last CBC Lab Results  Component Value Date   WBC  5.7 09/17/2022   HGB 12.6 09/17/2022   HCT 37.0 09/17/2022   MCV 92.0 09/17/2022   MCH 30.7 09/17/2022   RDW 12.2 09/17/2022   PLT 284 09/17/2022   Last metabolic panel Lab Results  Component Value Date   GLUCOSE 103 (H) 10/06/2022   NA 139 10/06/2022   K 3.7 10/06/2022   CL 98 10/06/2022   CO2 26 10/06/2022   BUN 10 10/06/2022   CREATININE 0.76 10/06/2022   EGFR 94 10/06/2022   CALCIUM 9.9 10/06/2022   PROT 6.3 (L) 09/17/2022   ALBUMIN 3.9 09/17/2022   BILITOT 0.5 09/17/2022   ALKPHOS 61 09/17/2022   AST 17 09/17/2022   ALT 17 09/17/2022   ANIONGAP 10 09/17/2022   Last lipids Lab Results  Component Value Date   CHOL 208 (H) 05/10/2014   HDL 65 05/10/2014   LDLCALC 126 (H) 05/10/2014   TRIG 86 05/10/2014   CHOLHDL 3.2 05/10/2014   Last thyroid functions Lab Results  Component Value Date   TSH 1.430 10/06/2022   ASSESSMENT AND PLAN:   New patient, establishing care.  #1 hypertension, well-controlled on HCTZ 25 mg a day. She will discuss getting a basic metabolic panel when she sees her heart doctor in a few days.  Preventative health care Vaccines: Tdap deferred, Prevnar 20 deferred, shingrix  deferred, flu deferred. Labs: She deferred lab work today.  She will discuss getting a basic metabolic panel when she sees her heart doctor in a few days. Cervical ca screening: Deferred Breast ca screening: Mammogram deferred for now, she will call back when she wants this ordered. Colon ca screening: Recall 2027  An After Visit Summary was printed and given to the patient.  Return in about 1 year (around 11/20/2024) for annual CPE (fasting).  Signed:  Gerlene Hockey, MD           11/21/2023

## 2023-11-25 ENCOUNTER — Ambulatory Visit: Payer: Self-pay | Attending: Internal Medicine | Admitting: Internal Medicine

## 2023-11-25 VITALS — BP 133/89 | HR 74 | Ht 65.5 in | Wt 183.0 lb

## 2023-11-25 DIAGNOSIS — Z79899 Other long term (current) drug therapy: Secondary | ICD-10-CM

## 2023-11-25 DIAGNOSIS — I10 Essential (primary) hypertension: Secondary | ICD-10-CM

## 2023-11-25 DIAGNOSIS — R002 Palpitations: Secondary | ICD-10-CM

## 2023-11-25 NOTE — Progress Notes (Signed)
 Cardiology Office Note:  .    Date:  11/25/2023  ID:  [name], DOB Jul 13, 1970, MRN [id number] PCP: Pcp, No   HeartCare Providers Cardiologist:  Stanly DELENA Leavens, MD     CC: HTN follow up  History of Present Illness: .    [name] is a 53 y.o. female with hx of diverticulosis, with sinus tachycardia on EKG and HTN.  Has recent ED visit. Neurology Dr. Voncile  recommended outpatient follow-up for TIA   [name] is a 53 year old female with hypertension who presents with fatigue, palpitations, and a query of a transient ischemic attack.  She experiences ongoing fatigue and palpitations, with the palpitations often associated with anxiety. Her father also experiences anxiety-related palpitations. She describes episodes of irregular heartbeats that improve with coughing. She has not completed a long-term heart monitor test due to lack of insurance.  She has a history of hypertension and is currently taking hydrochlorothiazide , which has resolved her previous leg swelling. Her home blood pressure readings are consistently lower than those taken in the clinic, which she attributes to anxiety during visits. She reports an improved ability to perform activities like yard work without feeling faint.  She mentions a new sensation of swallowing rocks localized near her collarbone, which she recently discussed with her primary care doctor.  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 She feels depressed and has a sense of having wasted time while searching for new employment. She is considering increasing her physical activity during this period of unemployment.  Relevant histories: .  Social; Has a pool, originally from [place]  ROS: As per HPI.  Cardiac Studies & Procedures    ______________________________________________________________________________________________     ECHOCARDIOGRAM  ECHOCARDIOGRAM COMPLETE 09/28/2022  Narrative ECHOCARDIOGRAM REPORT    Patient Name:   [name] Date of Exam: 09/28/2022 Medical Rec #:  [id number]           Height:       66.0 in Accession #:    [id number]          Weight:       182.0 lb Date of Birth:  [birth year]           BSA:          1.921 m Patient Age:    52 years            BP:           130/77 mmHg Patient Gender: F                   HR:           83 bpm. Exam Location:  Outpatient  Procedure: 2D Echo, 3D Echo, Color Doppler, Cardiac Doppler and Strain Analysis  Indications:    Fatigue  History:        Patient has no prior history of Echocardiogram examinations. Risk Factors:Former Smoker and Hypertension. Palpitations.  Sonographer:    Orvil Holmes RDCS Referring Phys: [id number] [facility] [name]  IMPRESSIONS   1. Left ventricular ejection fraction, by estimation, is 55 to 60%. The left ventricle has normal function. The left ventricle has no regional wall motion abnormalities. Left ventricular diastolic parameters were normal. 2. Right ventricular systolic function is normal. The right ventricular size is normal. Tricuspid regurgitation signal is inadequate for assessing PA pressure. 3. The mitral valve is degenerative.  No evidence of mitral valve regurgitation. No evidence of mitral stenosis. 4. The aortic valve is tricuspid. Aortic valve regurgitation is not visualized. Aortic valve sclerosis/calcification is present, without any evidence of aortic stenosis. 5. The inferior vena cava is normal in size with greater than 50% respiratory variability, suggesting right atrial pressure of 3 mmHg.  FINDINGS Left Ventricle: Left ventricular ejection fraction, by estimation, is 55 to 60%. The left ventricle has normal function. The left ventricle has no regional wall motion abnormalities. Global  longitudinal strain performed but not reported based on interpreter judgement due to suboptimal tracking. The left ventricular internal cavity size was normal in size. There is no left ventricular hypertrophy. Left ventricular diastolic parameters were normal. Normal left ventricular filling pressure.  Right Ventricle: The right ventricular size is normal. No increase in right ventricular wall thickness. Right ventricular systolic function is normal. Tricuspid regurgitation signal is inadequate for assessing PA pressure.  Left Atrium: Left atrial size was normal in size.  Right Atrium: Right atrial size was normal in size.  Pericardium: There is no evidence of pericardial effusion.  Mitral Valve: The mitral valve is degenerative in appearance. Mild to moderate mitral annular calcification. No evidence of mitral valve regurgitation. No evidence of mitral valve stenosis.  Tricuspid Valve: The tricuspid valve is normal in structure. Tricuspid valve regurgitation is not demonstrated. No evidence of tricuspid stenosis.  Aortic Valve: The aortic valve is tricuspid. Aortic valve regurgitation is not visualized. Aortic valve sclerosis/calcification is present, without any evidence of aortic stenosis.  Pulmonic Valve: The pulmonic valve was normal in structure. Pulmonic valve regurgitation is not visualized. No evidence of pulmonic stenosis.  Aorta: The aortic root is normal in size and structure.  Venous: The inferior vena cava is normal in size with greater than 50% respiratory variability, suggesting right atrial pressure of 3 mmHg.  IAS/Shunts: No atrial level shunt detected by color flow Doppler.   LEFT VENTRICLE PLAX 2D LVIDd:         3.63 cm      Diastology LVIDs:         1.92 cm      LV e' medial:    7.94 cm/s LV PW:         1.06 cm      LV E/e' medial:  5.9 LV IVS:        0.93 cm      LV e' lateral:   9.36 cm/s LVOT diam:     2.00 cm      LV E/e' lateral: 5.0 LV SV:         53 LV SV  Index:   28 LVOT Area:     3.14 cm  3D Volume EF: LV Volumes (MOD)            3D EF:        51 % LV vol d, MOD A2C: 92.2 ml  LV EDV:       114 ml LV vol d, MOD A4C: 120.0 ml LV ESV:       56 ml LV vol s, MOD A2C: 61.0 ml  LV SV:        58 ml LV vol s, MOD A4C: 60.4 ml LV SV MOD A2C:     31.2 ml LV SV MOD A4C:     120.0 ml LV SV MOD BP:      44.7 ml  RIGHT VENTRICLE RV Basal diam:  3.51 cm RV Mid diam:    2.97  cm RV S prime:     11.60 cm/s TAPSE (M-mode): 2.6 cm  LEFT ATRIUM             Index        RIGHT ATRIUM          Index LA diam:        3.20 cm 1.67 cm/m   RA Area:     9.63 cm LA Vol (A2C):   44.1 ml 22.95 ml/m  RA Volume:   20.80 ml 10.83 ml/m LA Vol (A4C):   24.1 ml 12.54 ml/m LA Biplane Vol: 34.2 ml 17.80 ml/m AORTIC VALVE LVOT Vmax:   83.80 cm/s LVOT Vmean:  55.100 cm/s LVOT VTI:    0.170 m  AORTA Ao Root diam: 3.20 cm Ao Asc diam:  3.30 cm  MITRAL VALVE MV Area (PHT): 5.13 cm    SHUNTS MV Decel Time: 148 msec    Systemic VTI:  0.17 m MV E velocity: 47.10 cm/s  Systemic Diam: 2.00 cm MV A velocity: 48.50 cm/s MV E/A ratio:  0.97  Wilbert Bihari MD Electronically signed by Wilbert Bihari MD Signature Date/Time: 09/28/2022/2:01:27 PM    Final          ______________________________________________________________________________________________       Physical Exam:    VS:  BP 133/89   Pulse 74   Ht 5' 5.5 (1.664 m)   Wt 183 lb (83 kg)   SpO2 98%   BMI 29.99 kg/m    Wt Readings from Last 3 Encounters:  11/25/23 183 lb (83 kg)  11/21/23 180 lb 12.8 oz (82 kg)  09/22/22 182 lb (82.6 kg)    Gen: no distress  Neck: No JVD Cardiac: No Rubs or Gallops, No murmur, RRR +2 radial pulses Respiratory: Clear to auscultation bilaterally, normal effort, normal  respiratory rate GI: Soft, nontender, non-distended  MS: no edema;  moves all extremities Integument: Skin feels warm Neuro:  At time of evaluation, alert and oriented to  person/place/time/situation  Psych: Normal affect, patient feels well   ASSESSMENT AND PLAN: .    Palpitations Anxiety Palpitations are minor and likely related to anxiety, improving with coughing. Possibility of paroxysmal supraventricular tachycardia exists, but it is not life-threatening and not associated with stroke. Symptoms do not warrant immediate testing due to lack of insurance. - If palpitations worsen, consider ordering a heart monitor to evaluate for paroxysmal supraventricular tachycardia.  Essential hypertension Hypertension is slightly elevated today but generally well-controlled on hydrochlorothiazide . Previous leg swelling has resolved with medication. Blood pressure is lower at home, suggesting possible white coat hypertension. Echocardiogram and TSH are normal. She is well-controlled on a single medication, indicating good management. - Order basic metabolic panel to monitor kidney function due to hydrochlorothiazide  use. - Transition hypertension management to primary care provider.  Patient is able to safely graduate to primary care practice - Preoperative cardiac risk stratification: as per PCP unless clinical change in cardiac symptoms - Secondary prevention strategy: -- HTN- goal < 130/80; she is at this goal with her home cuff;  -- HLD; when she gets a job we have discussed CAC scoring -- Medication Transition: hydrochlorothiazide  25 mg PO DAily  Our team is happy to see back if new changes  Stanly Leavens, MD FASE [facility] Cardiologist [facility]  [address], [address] [place], [place] [address] [phone]  10:50 AM

## 2023-11-25 NOTE — Patient Instructions (Signed)
 Medication Instructions:  No changes *If you need a refill on your cardiac medications before your next appointment, please call your pharmacy*  Lab Work: BMP If you have labs (blood work) drawn today and your tests are completely normal, you will receive your results only by: MyChart Message (if you have MyChart) OR A paper copy in the mail If you have any lab test that is abnormal or we need to change your treatment, we will call you to review the results.  Testing/Procedures: None ordered  Follow-Up: At [facility], you and your health needs are our priority.  As part of our continuing mission to provide you with exceptional heart care, our providers are all part of one team.  This team includes your primary Cardiologist (physician) and Advanced Practice Providers or APPs (Physician Assistants and Nurse Practitioners) who all work together to provide you with the care you need, when you need it.  Your next appointment:   Follow up with your Primary Care Provider for Hypertension     We recommend signing up for the patient portal called MyChart.  Sign up information is provided on this After Visit Summary.  MyChart is used to connect with patients for Virtual Visits (Telemedicine).  Patients are able to view lab/test results, encounter notes, upcoming appointments, etc.  Non-urgent messages can be sent to your provider as well.   To learn more about what you can do with MyChart, go to [email or web address].
